# Patient Record
Sex: Female | Born: 1990 | Race: Black or African American | Hispanic: No | Marital: Single | Smoking: Never smoker
Health system: Southern US, Community
[De-identification: ages and names within clinical notes are randomized; demographics above are authoritative.]

## PROBLEM LIST (undated history)

## (undated) DIAGNOSIS — L511 Stevens-Johnson syndrome: Secondary | ICD-10-CM

## (undated) DIAGNOSIS — J45909 Unspecified asthma, uncomplicated: Secondary | ICD-10-CM

## (undated) DIAGNOSIS — G43909 Migraine, unspecified, not intractable, without status migrainosus: Secondary | ICD-10-CM

## (undated) HISTORY — PX: ABDOMINAL HYSTERECTOMY: SHX81

## (undated) HISTORY — PX: TONSILLECTOMY: SUR1361

---

## 2018-04-26 ENCOUNTER — Emergency Department (HOSPITAL_COMMUNITY)
Admission: EM | Admit: 2018-04-26 | Discharge: 2018-04-26 | Disposition: A | Discharge status: Home / Self Care | Payer: No Typology Code available for payment source | Attending: Emergency Medicine | Admitting: Emergency Medicine

## 2018-04-26 ENCOUNTER — Encounter (HOSPITAL_COMMUNITY): Payer: Self-pay | Admitting: Emergency Medicine

## 2018-04-26 ENCOUNTER — Other Ambulatory Visit: Payer: Self-pay

## 2018-04-26 DIAGNOSIS — Y999 Unspecified external cause status: Secondary | ICD-10-CM | POA: Diagnosis not present

## 2018-04-26 DIAGNOSIS — Y92414 Local residential or business street as the place of occurrence of the external cause: Secondary | ICD-10-CM | POA: Diagnosis not present

## 2018-04-26 DIAGNOSIS — M25511 Pain in right shoulder: Secondary | ICD-10-CM | POA: Diagnosis not present

## 2018-04-26 DIAGNOSIS — Y9389 Activity, other specified: Secondary | ICD-10-CM | POA: Insufficient documentation

## 2018-04-26 DIAGNOSIS — M546 Pain in thoracic spine: Secondary | ICD-10-CM | POA: Diagnosis not present

## 2018-04-26 HISTORY — DX: Stevens-Johnson syndrome: L51.1

## 2018-04-26 MED ORDER — IBUPROFEN 800 MG PO TABS: 800.0000 mg | ORAL_TABLET | Freq: Three times a day (TID) | ORAL | 0 refills | Status: DC

## 2018-04-26 MED ORDER — METHOCARBAMOL 500 MG PO TABS: 500.0000 mg | ORAL_TABLET | Freq: Two times a day (BID) | ORAL | 0 refills | Status: DC

## 2018-04-26 MED ORDER — KETOROLAC TROMETHAMINE 60 MG/2ML IM SOLN
60.0000 mg | Freq: Once | INTRAMUSCULAR | Status: AC
  Administered 2018-04-26: 60 mg via INTRAMUSCULAR
  Filled 2018-04-26: qty 2
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
## 2018-04-26 NOTE — Discharge Instructions (Signed)
Take the prescribed medication as directed.  Can use heat therapy on your back. Follow-up with your primary care doctor. Return to the ED for new or worsening symptoms.

## 2020-09-24 ENCOUNTER — Other Ambulatory Visit: Payer: Self-pay

## 2020-09-24 ENCOUNTER — Encounter (HOSPITAL_BASED_OUTPATIENT_CLINIC_OR_DEPARTMENT_OTHER): Payer: Self-pay | Admitting: Emergency Medicine

## 2020-09-24 ENCOUNTER — Emergency Department (HOSPITAL_BASED_OUTPATIENT_CLINIC_OR_DEPARTMENT_OTHER)
Admission: EM | Admit: 2020-09-24 | Discharge: 2020-09-24 | Disposition: A | Discharge status: Home / Self Care | Payer: Medicaid Other | Attending: Emergency Medicine | Admitting: Emergency Medicine

## 2020-09-24 DIAGNOSIS — R1013 Epigastric pain: Secondary | ICD-10-CM | POA: Diagnosis not present

## 2020-09-24 DIAGNOSIS — M79602 Pain in left arm: Secondary | ICD-10-CM | POA: Insufficient documentation

## 2020-09-24 MED ORDER — NAPROXEN 500 MG PO TABS: 500.0000 mg | ORAL_TABLET | Freq: Two times a day (BID) | ORAL | 0 refills | Status: DC

## 2020-09-24 MED ORDER — KETOROLAC TROMETHAMINE 15 MG/ML IJ SOLN
15.0000 mg | Freq: Once | INTRAMUSCULAR | Status: AC
  Administered 2020-09-24: 15 mg via INTRAMUSCULAR
  Filled 2020-09-24: qty 1

## 2020-09-24 MED ORDER — ALUM & MAG HYDROXIDE-SIMETH 200-200-20 MG/5ML PO SUSP
30.0000 mL | Freq: Once | ORAL | Status: AC
  Administered 2020-09-24: 30 mL via ORAL
  Filled 2020-09-24: qty 30

## 2020-09-24 NOTE — ED Provider Notes (Signed)
[facility] EMERGENCY DEPARTMENT Provider Note   CSN: [id number] Arrival date & time: 09/24/20  2154     History Chief Complaint  Patient presents with  . Arm Pain    Beth Allison is a 30 y.o. female presenting for evaluation of left wrist and forearm pain and epigastric pain.  Patient states several hours ago she was folding laundry when she suddenly had pain in her left wrist and left forearm.  She describes it as a tingling feeling as well as pain.  She states her hand feels weak.  It does not extend into her upper arm.  No pain in her shoulder.  She denies fall, trauma, or injury.  She was not leaning on her elbows or her wrist at the time.  She is never had anything similar.  No symptoms on the right side.  She is right-handed.  She denies neck pain.  No erythema or warmth.  No fevers.  She has not taken anything for her symptoms. Additionally, she states around the same time she developed epigastric abdominal pain.  She has a history of ulcers, but states this does not feel similar.  She is on medication for GERD every day, has been taking as prescribed.  When asked if she wants further evaluation about her stomach, she states she is here today for her arm, not her stomach.  HPI     Past Medical History:  Diagnosis Date  . Stevens-Johnson disease (HCC)     There are no problems to display for this patient.   Past Surgical History:  Procedure Laterality Date  . ABDOMINAL HYSTERECTOMY    . CESAREAN SECTION       OB History   No obstetric history on file.     No family history on file.  Social History   Tobacco Use  . Smoking status: Never Smoker  . Smokeless tobacco: Never Used  Substance Use Topics  . Alcohol use: Yes  . Drug use: Never    Home Medications Prior to Admission medications   Medication Sig Start Date End Date Taking? Authorizing Provider  naproxen (NAPROSYN) 500 MG tablet Take 1 tablet (500 mg total) by mouth 2 (two) times daily  with a meal. 09/24/20  Yes Caccavale, Sophia, PA-C  ibuprofen (ADVIL,MOTRIN) 800 MG tablet Take 1 tablet (800 mg total) by mouth 3 (three) times daily. 04/26/18   Garlon Hatchet, PA-C  methocarbamol (ROBAXIN) 500 MG tablet Take 1 tablet (500 mg total) by mouth 2 (two) times daily. 04/26/18   Garlon Hatchet, PA-C    Allergies    Tylenol [acetaminophen]  Review of Systems   Review of Systems  Gastrointestinal: Positive for abdominal pain.  Musculoskeletal: Positive for arthralgias.  All other systems reviewed and are negative.   Physical Exam Updated Vital Signs BP (!) 131/95 (BP Location: Right Arm)   Pulse 79   Temp 99 F (37.2 C) (Oral)   Resp 20   Ht 5' (1.524 m)   Wt 97.5 kg   SpO2 100%   BMI 41.99 kg/m   Physical Exam Vitals and nursing note reviewed.  Constitutional:      General: She is not in acute distress.    Appearance: She is well-developed and well-nourished. She is obese.     Comments: Resting in the bed in NAD  HENT:     Head: Normocephalic and atraumatic.  Eyes:     Extraocular Movements: Extraocular movements intact and EOM normal.  Conjunctiva/sclera: Conjunctivae normal.     Pupils: Pupils are equal, round, and reactive to light.  Cardiovascular:     Rate and Rhythm: Normal rate and regular rhythm.     Pulses: Normal pulses and intact distal pulses.  Pulmonary:     Effort: Pulmonary effort is normal. No respiratory distress.     Breath sounds: Normal breath sounds. No wheezing.  Abdominal:     General: There is no distension.     Palpations: Abdomen is soft. There is no mass.     Tenderness: There is no abdominal tenderness. There is no guarding or rebound.     Comments: No ttp on my evaluation. No rigidity, guarding, distention  Musculoskeletal:     Cervical back: Normal range of motion and neck supple.     Comments: No erythema, warmth, or swelling of any joint noted.  Full active range of motion at the shoulder and elbow without difficulty.   Positive Tinel's on the left side. Grip strength equal bilaterally. Pt reports decreased sensation of L hand.   Skin:    General: Skin is warm and dry.     Capillary Refill: Capillary refill takes less than 2 seconds.  Neurological:     Mental Status: She is alert and oriented to person, place, and time.  Psychiatric:        Mood and Affect: Mood and affect normal.     ED Results / Procedures / Treatments   Labs (all labs ordered are listed, but only abnormal results are displayed) Labs Reviewed - No data to display  EKG None  Radiology No results found.  Procedures Procedures   Medications Ordered in ED Medications  ketorolac (TORADOL) 15 MG/ML injection 15 mg (15 mg Intramuscular Given 09/24/20 2327)  alum & mag hydroxide-simeth (MAALOX/MYLANTA) 200-200-20 MG/5ML suspension 30 mL (30 mLs Oral Given 09/24/20 2327)    ED Course  I have reviewed the triage vital signs and the nursing notes.  Pertinent labs & imaging results that were available during my care of the patient were reviewed by me and considered in my medical decision making (see chart for details).    MDM Rules/Calculators/A&P                          Patient presenting for evaluation of left wrist and arm pain.  On exam, patient appears nontoxic.  No neurologic deficits noted on my exam other than slight decrease sensation of the left hand.  Have her grip strength equal.  Positive Tinel's.  Likely carpal tunnel.  No neck pain or indication of cervical radiculopathy.  No other neurologic deficits, considering patient's age and last lack of risk factors, doubt stroke.  Discussed treatment with NSAIDs, brace, and ice.  Encourage follow-up with PCP/hand as needed. Regarding patient's abdominal pain, patient states she is here today for her arm pain.  She does not want further abdominal work-up.  I encourage symptomatic treatment and follow-up with GI as needed.  At this time, patient appears safe for discharge.  Return  precautions given.  Patient states she understands and agrees to plan.   Final Clinical Impression(s) / ED Diagnoses Final diagnoses:  Left arm pain  Epigastric abdominal pain    Rx / DC Orders ED Discharge Orders         Ordered    naproxen (NAPROSYN) 500 MG tablet  2 times daily with meals        09/24/20 2335  Alveria Apley, PA-C 09/24/20 2347    Paula Libra, MD 09/25/20 [phone]

## 2020-09-24 NOTE — Discharge Instructions (Addendum)
Your symptoms of your left forearm are likely due to nerve irritation/inflammation such as carpal tunnel.  Wear the brace, especially at night to help with positioning. Take naproxen 2 times a day with meals.  Do not take other anti-inflammatories at the same time (Advil, Motrin, ibuprofen, Aleve). You may supplement with Tylenol if you need further pain control. Use ice packs on the inside of your wrist and the outside of your elbow where you have pain for 20 minutes, 3 times a day to help with pain and swelling. Follow-up with your primary care doctor and/or the hand doctor listed below if your symptoms are not improving after a week of this treatment. Return to the emergency room if you develop fevers, severe worsening pain, color change of your hand, or any new, worsening, or concerning symptoms.

## 2020-09-24 NOTE — ED Triage Notes (Signed)
L arm pain and tingling from elbow down x3 hours. Pt has complete ROM. No injury or heavy lifting. Also reports epigastric tenderness x 2-3 hours. Denies other sx.

## 2021-02-04 ENCOUNTER — Encounter (HOSPITAL_BASED_OUTPATIENT_CLINIC_OR_DEPARTMENT_OTHER): Payer: Self-pay | Admitting: *Deleted

## 2021-02-04 ENCOUNTER — Emergency Department (HOSPITAL_BASED_OUTPATIENT_CLINIC_OR_DEPARTMENT_OTHER)
Admission: EM | Admit: 2021-02-04 | Discharge: 2021-02-04 | Disposition: A | Discharge status: Home / Self Care | Payer: Medicaid Other | Attending: Emergency Medicine | Admitting: Emergency Medicine

## 2021-02-04 ENCOUNTER — Other Ambulatory Visit: Payer: Self-pay

## 2021-02-04 DIAGNOSIS — Z79899 Other long term (current) drug therapy: Secondary | ICD-10-CM | POA: Diagnosis not present

## 2021-02-04 DIAGNOSIS — R040 Epistaxis: Secondary | ICD-10-CM | POA: Insufficient documentation

## 2021-02-04 DIAGNOSIS — I10 Essential (primary) hypertension: Secondary | ICD-10-CM | POA: Diagnosis not present

## 2021-02-04 DIAGNOSIS — J45909 Unspecified asthma, uncomplicated: Secondary | ICD-10-CM | POA: Insufficient documentation

## 2021-02-04 HISTORY — DX: Unspecified asthma, uncomplicated: J45.909

## 2021-02-04 MED ORDER — PROCHLORPERAZINE MALEATE 10 MG PO TABS: 10.0000 mg | ORAL_TABLET | Freq: Two times a day (BID) | ORAL | 0 refills | Status: DC | PRN

## 2021-02-04 NOTE — ED Provider Notes (Signed)
[facility] EMERGENCY DEPARTMENT Provider Note   CSN: [id number] Arrival date & time: 02/04/21  1728     History Chief Complaint  Patient presents with   Epistaxis    Beth Allison is a 30 y.o. female.  30 yo F with a chief complaints of epistaxis.  Is been off and on for the past 3 days.  She denies any trauma to the nose.  Seems to come and go at random.  Her mother checked her blood pressure while she was having nosebleed and told her it was significantly elevated and she should come to the ED for evaluation.  Patient has been having migraines off and on for the past 3 days as well.  Tells me she has a known history of migraines.  Denies one-sided numbness weakness difficulty speech swallowing denies trauma denies fever.  Denies sinus congestion.  Denies ear pain.  The history is provided by the patient.  Epistaxis Location:  Unable to specify Severity:  Moderate Duration:  3 days Timing:  Intermittent Progression:  Waxing and waning Chronicity:  New Relieved by:  Nothing Worsened by:  Nothing Ineffective treatments:  None tried Associated symptoms: no congestion, no dizziness, no fever and no headaches       Past Medical History:  Diagnosis Date   Asthma    Stevens-Johnson disease (HCC)     There are no problems to display for this patient.   Past Surgical History:  Procedure Laterality Date   ABDOMINAL HYSTERECTOMY     CESAREAN SECTION       OB History   No obstetric history on file.     No family history on file.  Social History   Tobacco Use   Smoking status: Never   Smokeless tobacco: Never  Vaping Use   Vaping Use: Never used  Substance Use Topics   Alcohol use: Not Currently   Drug use: Never    Home Medications Prior to Admission medications   Medication Sig Start Date End Date Taking? Authorizing Provider  prochlorperazine (COMPAZINE) 10 MG tablet Take 1 tablet (10 mg total) by mouth 2 (two) times daily as needed  (headache). 02/04/21  Yes Melene Plan, DO  ibuprofen (ADVIL,MOTRIN) 800 MG tablet Take 1 tablet (800 mg total) by mouth 3 (three) times daily. 04/26/18   Garlon Hatchet, PA-C  methocarbamol (ROBAXIN) 500 MG tablet Take 1 tablet (500 mg total) by mouth 2 (two) times daily. 04/26/18   Garlon Hatchet, PA-C  naproxen (NAPROSYN) 500 MG tablet Take 1 tablet (500 mg total) by mouth 2 (two) times daily with a meal. 09/24/20   Caccavale, Sophia, PA-C    Allergies    Other and Tylenol [acetaminophen]  Review of Systems   Review of Systems  Constitutional:  Negative for chills and fever.  HENT:  Positive for nosebleeds. Negative for congestion and rhinorrhea.   Eyes:  Negative for redness and visual disturbance.  Respiratory:  Negative for shortness of breath and wheezing.   Cardiovascular:  Negative for chest pain and palpitations.  Gastrointestinal:  Negative for nausea and vomiting.  Genitourinary:  Negative for dysuria and urgency.  Musculoskeletal:  Negative for arthralgias and myalgias.  Skin:  Negative for pallor and wound.  Neurological:  Negative for dizziness and headaches.   Physical Exam Updated Vital Signs BP (!) 137/94 (BP Location: Right Arm)   Pulse 77   Temp 98.5 F (36.9 C) (Oral)   Resp 16   Ht 5' (1.524 m)  Wt 93 kg   SpO2 100%   BMI 40.04 kg/m   Physical Exam Vitals and nursing note reviewed.  Constitutional:      General: She is not in acute distress.    Appearance: She is well-developed. She is not diaphoretic.  HENT:     Head: Normocephalic and atraumatic.     Nose:     Comments: Possible nasal polyp, otherwise no abnormality, no signs of bleeding no increased vascularity at Kiesselbach's plexus.  No significant sinus tenderness to percussion.  Mild posterior nasal drip.  TMs without effusion or bulging. Eyes:     Pupils: Pupils are equal, round, and reactive to light.  Cardiovascular:     Rate and Rhythm: Normal rate and regular rhythm.     Heart sounds: No  murmur heard.   No friction rub. No gallop.  Pulmonary:     Effort: Pulmonary effort is normal.     Breath sounds: No wheezing or rales.  Abdominal:     General: There is no distension.     Palpations: Abdomen is soft.     Tenderness: There is no abdominal tenderness.  Musculoskeletal:        General: No tenderness.     Cervical back: Normal range of motion and neck supple.  Skin:    General: Skin is warm and dry.  Neurological:     Mental Status: She is alert and oriented to person, place, and time.  Psychiatric:        Behavior: Behavior normal.    ED Results / Procedures / Treatments   Labs (all labs ordered are listed, but only abnormal results are displayed) Labs Reviewed - No data to display  EKG None  Radiology No results found.  Procedures Procedures   Medications Ordered in ED Medications - No data to display  ED Course  I have reviewed the triage vital signs and the nursing notes.  Pertinent labs & imaging results that were available during my care of the patient were reviewed by me and considered in my medical decision making (see chart for details).    MDM Rules/Calculators/A&P                          30 yo F with a chief complaints of epistaxis.  Off and on for 3 days.  Her mother checked her blood pressure while she was having a nosebleed and noted it was elevated.  She is here to be evaluated.  Has had headaches off and on for 3 days as well.  Well-appearing and nontoxic no gross neurologic deficit.  No ongoing bleeding.  Blood pressure is very mildly elevated here.  We will have her follow-up with her family doctor for possible blood pressure management.  Unfortunately is driving home and is unable to get her headache cocktail here.  Has a Tylenol allergy and so unable to give her Fioricet.  We will do a trial of oral Compazine at home.  6:32 PM:  I have discussed the diagnosis/risks/treatment options with the patient and believe the pt to be eligible  for discharge home to follow-up with PCP. We also discussed returning to the ED immediately if new or worsening sx occur. We discussed the sx which are most concerning (e.g., sudden worsening pain, fever, inability to tolerate by mouth, epistaxis that does not improve with pressure) that necessitate immediate return. Medications administered to the patient during their visit and any new prescriptions provided to the  patient are listed below.  Medications given during this visit Medications - No data to display   The patient appears reasonably screen and/or stabilized for discharge and I doubt any other medical condition or other [facility] requiring further screening, evaluation, or treatment in the ED at this time prior to discharge.   Final Clinical Impression(s) / ED Diagnoses Final diagnoses:  Epistaxis  Primary hypertension    Rx / DC Orders ED Discharge Orders          Ordered    prochlorperazine (COMPAZINE) 10 MG tablet  2 times daily PRN        02/04/21 1828             Melene Plan, DO 02/04/21 1832

## 2021-02-04 NOTE — Discharge Instructions (Addendum)
If you have another nosebleed please hold pressure for 15 minutes without peaking.  If this does not resolve the bleeding try again and you can apply ice to the bridge of your nose as well.  If this does not stop the bleeding then return to the emergency department for evaluation.  Discussed with your family doctor tomorrow that your blood pressure was elevated.  They may want to check it in the office when you are not having a headache or nosebleeds.  Tried the medicine I prescribed to see if it helps your headache.  Sometimes it helps to take it with Benadryl.  He can also take ibuprofen or naproxen.

## 2021-02-04 NOTE — ED Triage Notes (Signed)
Pt reports headaches ("migraines"), with elevated BP (164/94) and nosebleeds x 3 days. Reports last nosebleed around noon. States she has headache now and took ibuprofen around 2 pm. No bleeding currently

## 2021-02-11 ENCOUNTER — Other Ambulatory Visit: Payer: Self-pay

## 2021-02-11 DIAGNOSIS — R040 Epistaxis: Secondary | ICD-10-CM | POA: Diagnosis not present

## 2021-02-11 DIAGNOSIS — R519 Headache, unspecified: Secondary | ICD-10-CM | POA: Insufficient documentation

## 2021-02-11 DIAGNOSIS — J45909 Unspecified asthma, uncomplicated: Secondary | ICD-10-CM | POA: Diagnosis not present

## 2021-02-11 NOTE — ED Triage Notes (Signed)
Pt c/o headaches, nose bleeds and neck pain for several days. Been seen multiple ERs for same. No bleeding at this time

## 2021-02-12 ENCOUNTER — Encounter (HOSPITAL_BASED_OUTPATIENT_CLINIC_OR_DEPARTMENT_OTHER): Payer: Self-pay | Admitting: Emergency Medicine

## 2021-02-12 ENCOUNTER — Emergency Department (HOSPITAL_BASED_OUTPATIENT_CLINIC_OR_DEPARTMENT_OTHER): Payer: Medicaid Other

## 2021-02-12 ENCOUNTER — Emergency Department (HOSPITAL_BASED_OUTPATIENT_CLINIC_OR_DEPARTMENT_OTHER)
Admission: EM | Admit: 2021-02-12 | Discharge: 2021-02-12 | Disposition: A | Discharge status: Home / Self Care | Payer: Medicaid Other | Attending: Emergency Medicine | Admitting: Emergency Medicine

## 2021-02-12 DIAGNOSIS — R519 Headache, unspecified: Secondary | ICD-10-CM

## 2021-02-12 LAB — PREGNANCY, URINE: Preg Test, Ur: NEGATIVE

## 2021-02-12 MED ORDER — NAPROXEN 250 MG PO TABS
500.0000 mg | ORAL_TABLET | ORAL | Status: AC
  Administered 2021-02-12: 500 mg via ORAL
  Filled 2021-02-12: qty 2

## 2021-02-12 NOTE — ED Provider Notes (Signed)
[facility] EMERGENCY DEPARTMENT Provider Note   CSN: [id number] Arrival date & time: 02/11/21  2042     History Chief Complaint  Patient presents with   Epistaxis   Headache    Beth Allison is a 30 y.o. female.  The history is provided by the patient.  Headache Pain location:  Generalized Quality:  Dull Radiates to:  Does not radiate Severity currently:  8/10 Severity at highest:  9/10 Onset quality:  Gradual Duration:  1 week Timing:  Constant Progression:  Waxing and waning Chronicity:  Recurrent Similar to prior headaches: yes   Context: not exposure to bright light, not caffeine, not coughing and not intercourse   Relieved by:  Nothing Worsened by:  Nothing Ineffective treatments:  None tried Associated symptoms: no abdominal pain, no back pain, no blurred vision, no congestion, no cough, no diarrhea, no dizziness, no drainage, no ear pain, no eye pain, no facial pain, no fatigue, no fever, no focal weakness, no hearing loss, no loss of balance, no nausea, no near-syncope, no neck stiffness, no numbness, no paresthesias, no photophobia, no seizures, no sinus pressure, no sore throat, no swollen glands, no syncope, no tingling, no URI, no visual change, no vomiting and no weakness   Risk factors: no anger   Patient reports a h/o migraines and states this headache is similar for one week but hasn't taken anything secondary to [employer] from antibiotics.  No f/c/r.  Has intermittently had a nose bleed.  Has not tried any of the therapies suggested on previous ER visits.  No Confusion, no proptosis.      Past Medical History:  Diagnosis Date   Asthma    Stevens-Johnson disease (HCC)     There are no problems to display for this patient.   Past Surgical History:  Procedure Laterality Date   ABDOMINAL HYSTERECTOMY     CESAREAN SECTION       OB History   No obstetric history on file.     History reviewed. No pertinent family  history.  Social History   Tobacco Use   Smoking status: Never   Smokeless tobacco: Never  Vaping Use   Vaping Use: Never used  Substance Use Topics   Alcohol use: Not Currently   Drug use: Never    Home Medications Prior to Admission medications   Medication Sig Start Date End Date Taking? Authorizing Provider  ibuprofen (ADVIL,MOTRIN) 800 MG tablet Take 1 tablet (800 mg total) by mouth 3 (three) times daily. 04/26/18   Garlon Hatchet, PA-C  methocarbamol (ROBAXIN) 500 MG tablet Take 1 tablet (500 mg total) by mouth 2 (two) times daily. 04/26/18   Garlon Hatchet, PA-C  naproxen (NAPROSYN) 500 MG tablet Take 1 tablet (500 mg total) by mouth 2 (two) times daily with a meal. 09/24/20   Caccavale, Sophia, PA-C  prochlorperazine (COMPAZINE) 10 MG tablet Take 1 tablet (10 mg total) by mouth 2 (two) times daily as needed (headache). 02/04/21   Melene Plan, DO    Allergies    Other and Tylenol [acetaminophen]  Review of Systems   Review of Systems  Constitutional:  Negative for fatigue and fever.  HENT:  Negative for congestion, ear pain, hearing loss, postnasal drip, sinus pressure and sore throat.   Eyes:  Negative for blurred vision, photophobia and pain.  Respiratory:  Negative for cough.   Cardiovascular:  Negative for leg swelling, syncope and near-syncope.  Gastrointestinal:  Negative for abdominal pain, diarrhea, nausea and vomiting.  Genitourinary:  Negative for difficulty urinating.  Musculoskeletal:  Negative for back pain and neck stiffness.  Skin:  Negative for rash.  Neurological:  Negative for dizziness, focal weakness, seizures, facial asymmetry, speech difficulty, weakness, numbness, paresthesias and loss of balance.  Psychiatric/Behavioral:  Negative for agitation and confusion.   All other systems reviewed and are negative.  Physical Exam Updated Vital Signs BP 110/89   Pulse 68   Temp 98.3 F (36.8 C) (Oral)   Resp 18   Ht 5' (1.524 m)   Wt 97.5 kg   SpO2  98%   BMI 41.99 kg/m   Physical Exam Vitals and nursing note reviewed.  Constitutional:      General: She is not in acute distress.    Appearance: Normal appearance.  HENT:     Head: Normocephalic.     Comments:  Proptosis, disks are sharp, intact cognition     Nose: Nose normal.     Mouth/Throat:     Mouth: Mucous membranes are moist.  Eyes:     Extraocular Movements: Extraocular movements intact.     Conjunctiva/sclera: Conjunctivae normal.     Pupils: Pupils are equal, round, and reactive to light.  Cardiovascular:     Rate and Rhythm: Normal rate and regular rhythm.     Pulses: Normal pulses.     Heart sounds: Normal heart sounds.  Pulmonary:     Effort: Pulmonary effort is normal.     Breath sounds: Normal breath sounds.  Abdominal:     General: Abdomen is flat. Bowel sounds are normal.     Palpations: Abdomen is soft.     Tenderness: There is no abdominal tenderness. There is no guarding.  Musculoskeletal:        General: Normal range of motion.     Cervical back: Normal range of motion and neck supple.  Skin:    General: Skin is warm and dry.     Capillary Refill: Capillary refill takes less than 2 seconds.  Neurological:     General: No focal deficit present.     Mental Status: She is alert and oriented to person, place, and time.     Cranial Nerves: No cranial nerve deficit.     Deep Tendon Reflexes: Reflexes normal.  Psychiatric:        Mood and Affect: Mood normal.        Behavior: Behavior normal.    ED Results / Procedures / Treatments   Labs (all labs ordered are listed, but only abnormal results are displayed) Labs Reviewed  PREGNANCY, URINE    EKG None  Radiology CT Head Wo Contrast  Result Date: 02/12/2021 CLINICAL DATA:  Headaches, epistaxis and neck pain for several evaluated of multiple emergency departments EXAM: CT HEAD WITHOUT CONTRAST TECHNIQUE: Contiguous axial images were obtained from the base of the skull through the vertex  without intravenous contrast. COMPARISON:  Maxillofacial CT 09/08/2016, CT head 07/23/2016 FINDINGS: Brain: No evidence of acute infarction, hemorrhage, hydrocephalus, extra-axial collection, visible mass lesion or mass effect. Vascular: No hyperdense vessel or unexpected calcification. Skull: No calvarial fracture or suspicious osseous lesion. No scalp swelling or hematoma. Sinuses/Orbits: Minimal mural thickening in the paranasal sinuses. No layering air-fluid levels or pneumatized secretions. Mastoid air cells are clear. Middle ear cavities are clear. Included orbital structures are unremarkable. Other: None IMPRESSION: No acute intracranial abnormality. Electronically Signed   By: Kreg Shropshire M.D.   On: 02/12/2021 00:38    Procedures Procedures   Medications Ordered in  ED Medications  naproxen (NAPROSYN) tablet 500 mg (has no administration in time range)    ED Course  I have reviewed the triage vital signs and the nursing notes.  Pertinent labs & imaging results that were available during my care of the patient were reviewed by me and considered in my medical decision making (see chart for details).    MNo proptosis, no changes in cognition, disks are sharp.  I do not believe this is a dural sinus thrombosis.  No signs of meningitis.  No SAH.  Well appearing.  Take ibuprofen at home for pain and use a vaporizer to prevent nose bleeds.  Take to your family doctor.    Stable for discharge with close follow up.    Beth Allison was evaluated in Emergency Department on 02/12/2021 for the symptoms described in the history of present illness. She was evaluated in the context of the global COVID-19 pandemic, which necessitated consideration that the patient might be at risk for infection with the SARS-CoV-2 virus that causes COVID-19. Institutional protocols and algorithms that pertain to the evaluation of patients at risk for COVID-19 are in a state of rapid change based on information released  by regulatory bodies including the CDC and federal and state organizations. These policies and algorithms were followed during the patient's care in the ED.  Final Clinical Impression(s) / ED Diagnoses Final diagnoses:  None  Return for intractable cough, coughing up blood, fevers > 100.4 unrelieved by medication, shortness of breath, intractable vomiting, chest pain, shortness of breath, weakness, numbness, changes in speech, facial asymmetry, abdominal pain, passing out, Inability to tolerate liquids or food, cough, altered mental status or any concerns. No signs of systemic illness or infection. The patient is nontoxic-appearing on exam and vital signs are within normal limits. I have reviewed the triage vital signs and the nursing notes. Pertinent labs & imaging results that were available during my care of the patient were reviewed by me and considered in my medical decision making (see chart for details). After history, exam, and medical workup I feel the patient has been appropriately medically screened and is safe for discharge home. Pertinent diagnoses were discussed with the patient. Patient was given return precautions.  Rx / DC Orders ED Discharge Orders     None        Demaris Leavell, MD 02/12/21 [phone]

## 2021-06-10 NOTE — Progress Notes (Deleted)
NEW PATIENT Date of Service/Encounter:  06/10/21 Referring provider: Alm Bustard, MD Primary care provider: [facility]  Subjective:  Beth Allison is a 30 y.o. female with a PMHx of migraine and depression presenting today for evaluation of *** History obtained from: chart review and {Persons; PED relatives w/patient:19415::"patient"}.   + trichomonas: Flagyl desensitization  Other allergy screening: Asthma: {Blank single:19197::"yes","no"} Rhino conjunctivitis: {Blank single:19197::"yes","no"} Food allergy: {Blank single:19197::"yes","no"} Medication allergy: {Blank single:19197::"yes","no"} Hymenoptera allergy: {Blank single:19197::"yes","no"} Urticaria: {Blank single:19197::"yes","no"} Eczema:{Blank single:19197::"yes","no"} History of recurrent infections suggestive of immunodeficency: {Blank single:19197::"yes","no"} ***Vaccinations are up to date.   Past Medical History: Past Medical History:  Diagnosis Date   Asthma    Stevens-Johnson disease (HCC)    Medication List:  Current Outpatient Medications  Medication Sig Dispense Refill   ibuprofen (ADVIL,MOTRIN) 800 MG tablet Take 1 tablet (800 mg total) by mouth 3 (three) times daily. 21 tablet 0   methocarbamol (ROBAXIN) 500 MG tablet Take 1 tablet (500 mg total) by mouth 2 (two) times daily. 20 tablet 0   naproxen (NAPROSYN) 500 MG tablet Take 1 tablet (500 mg total) by mouth 2 (two) times daily with a meal. 20 tablet 0   prochlorperazine (COMPAZINE) 10 MG tablet Take 1 tablet (10 mg total) by mouth 2 (two) times daily as needed (headache). 10 tablet 0   No current facility-administered medications for this visit.   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 Social History: Beth Allison lives ***.   ROS:  All other systems negative except as noted per HPI.  Objective:  There were no vitals taken for this visit. There is no height or weight on file to calculate BMI. Physical Exam:  General Appearance:  Alert, cooperative, no distress, appears stated age  Head:  Normocephalic, without obvious abnormality, atraumatic  Eyes:  Conjunctiva clear, EOM's intact  Nose: Nares normal  Throat: Lips, tongue normal; teeth and gums normal  Neck: Supple, symmetrical  Lungs:   Respirations unlabored, no coughing  Heart:  Appears well perfused  Extremities: No edema  Skin: Skin color, texture, turgor normal, no rashes or lesions on visualized portions of skin  Neurologic: No gross deficits     Diagnostics: Spirometry:  Tracings reviewed. Her effort: {Blank single:19197::"Good reproducible efforts.","It was hard to get consistent efforts and there is a question as to whether this reflects a maximal maneuver.","Poor effort, data can not be interpreted."} FVC: ***L FEV1: ***L, ***% predicted FEV1/FVC ratio: ***% Interpretation: {Blank single:19197::"Spirometry consistent with mild obstructive disease","Spirometry consistent with moderate obstructive disease","Spirometry consistent with severe obstructive disease","Spirometry consistent with possible restrictive disease","Spirometry consistent with mixed obstructive and restrictive disease","Spirometry uninterpretable due to technique","Spirometry consistent with normal pattern","No overt abnormalities noted given today's efforts"}.  Please see scanned spirometry results for details.  Skin Testing: {Blank single:19197::"Select foods","Environmental allergy panel","Environmental allergy panel and select foods","Food allergy panel","None","Deferred due to recent antihistamines use"}. Positive test  to: ***. Negative test to: ***.  Results discussed with patient/family.   {Blank single:19197::"Allergy testing results were read and interpreted by myself, documented by clinical staff."," "}  Assessment and Plan  There are no Patient  Instructions on file for this visit.  No follow-ups on file.  {Blank single:19197::"This note in its entirety was forwarded to the Provider who requested this consultation."}  Thank you for your kind referral. I appreciate the opportunity to take part in Beth Allison's care. Please do not hesitate to contact me with questions.***  Sincerely,  Tonny Bollman, MD [facility]

## 2021-06-11 ENCOUNTER — Ambulatory Visit: Payer: Medicaid Other | Admitting: Internal Medicine

## 2021-06-19 ENCOUNTER — Ambulatory Visit (INDEPENDENT_AMBULATORY_CARE_PROVIDER_SITE_OTHER): Payer: Medicaid Other | Admitting: Internal Medicine

## 2021-06-19 ENCOUNTER — Other Ambulatory Visit: Payer: Self-pay

## 2021-06-19 ENCOUNTER — Encounter: Payer: Self-pay | Admitting: Internal Medicine

## 2021-06-19 VITALS — BP 120/70 | HR 76 | Temp 98.4°F | Resp 20 | Ht 61.0 in | Wt 225.8 lb

## 2021-06-19 DIAGNOSIS — L511 Stevens-Johnson syndrome: Secondary | ICD-10-CM | POA: Diagnosis not present

## 2021-06-19 DIAGNOSIS — J3089 Other allergic rhinitis: Secondary | ICD-10-CM | POA: Diagnosis not present

## 2021-06-19 DIAGNOSIS — J4530 Mild persistent asthma, uncomplicated: Secondary | ICD-10-CM | POA: Diagnosis not present

## 2021-06-19 NOTE — Progress Notes (Signed)
NEW PATIENT Date of Service/Encounter:  06/19/21 Referring provider: [facility] Primary care provider: [facility]  Subjective:  Beth Allison is a 30 y.o. female with a PMHx of obesity, persistent asthma, multiple drug reactions, OSA  presenting today for evaluation of flagyl allergy in the setting  of trichomonas infection  History obtained from: chart review and patient.   1) Drug Reactions: Reaction occurred on 12/12/2016 -  presenting as mucositis, hepatitis with oral, occular, and vaginal involvement.  External skin involvement totaled 35% and she was treated in the Burn Unit for care.  At that time ciprofloxacin was the potential culprit drug per [facility] discharge note.  However records show exposure to amoxillicin, ceftriaxone, ciprofloxacin and flagyl x1 in the ED 24 hours prior to onset of symptoms.  She has avoided all of these medications since.  She was treated with IV steroids and conservative measures in the BICU.    2) Her allergy and rhinitis are managed by [facility] (unknown provider and records are unavailable).  She reports recent allergy testing to food and environmental with plan to start AIT.  They prescribe her albuterol, symbicort as well.  Denies any hospitalizations, ED visits, OCS or antibiotics in past few months. No lifetime hospitalizations or intubations for asthma.  She is not on any medical therapy for rhinitis at this time.   Other allergy screening: Asthma: yes Rhino conjunctivitis: yes Food allergy: no Medication allergy: yes : multiple drug allergies including SJS like reaction including: all beta lactams, cipro, flagyl , bactrim, avoids carbapenams due to cross reactivity concerns  Hymenoptera allergy: no Urticaria: no Eczema:no History of recurrent infections suggestive of immunodeficency: no Age appropriate Vaccinations are up to date.   Past Medical History: Past Medical History:  Diagnosis Date   Asthma     Stevens-Johnson disease (HCC)    Medication List:  Current Outpatient Medications  Medication Sig Dispense Refill   ibuprofen (ADVIL,MOTRIN) 800 MG tablet Take 1 tablet (800 mg total) by mouth 3 (three) times daily. 21 tablet 0   methocarbamol (ROBAXIN) 500 MG tablet Take 1 tablet (500 mg total) by mouth 2 (two) times daily. 20 tablet 0   naproxen (NAPROSYN) 500 MG tablet Take 1 tablet (500 mg total) by mouth 2 (two) times daily with a meal. 20 tablet 0   prochlorperazine (COMPAZINE) 10 MG tablet Take 1 tablet (10 mg total) by mouth 2 (two) times daily as needed (headache). 10 tablet 0   No current facility-administered medications for this visit.   Known Allergies:  Allergies  Allergen Reactions   Other Other (See Comments)    Pt states she is unable to take multiple antibiotics due to SJS   Tylenol [Acetaminophen] Other (See Comments)    Beth Allison   Past Surgical History: Past Surgical History:  Procedure Laterality Date   ABDOMINAL HYSTERECTOMY     CESAREAN SECTION     Family History: No family history on file. Social History: Beth Allison lives single-family home is 30 years old.  She has carpets throughout.  She has gas heat and central cooling.  She does have a dog inside her home.  No smoke exposure inside or outside of her home.  She is not currently working..   ROS:  All other systems negative except as noted per HPI.  Objective:  Blood pressure 120/70, pulse 76, temperature 98.4 F (36.9 C), temperature source Temporal, resp. rate 20, height 5\' 1"  (1.549 m), weight 225 lb 12.8 oz (102.4  kg), SpO2 98 %. Body mass index is 42.66 kg/m. Physical Exam:  General Appearance:  Alert, cooperative, no distress, appears stated age  Head:  Normocephalic, without obvious abnormality, atraumatic  HEENT  Conjunctiva clear, EOM's intact, TM- intact bilaterally, nasal mucosa erythematous with enlarged IT, no rhinnorhea,    Nasal polyposis not noted on limited external  exam   Throat: Lips, tongue normal; teeth and gums normal, oral mucosa normal without exudates, posterior pharyngeal cobblestoning not noted  Neck: Supple, symmetrical  Lungs:   Respirations unlabored, no coughing, Breath Sounds bilaterally, no wheeze, crackles or rales  Heart:  Appears well perfused, S1 S2 normal, no murmurs, rubs or gallops, regular rate or rhythm  Extremities: No edema  Skin: Skin color, texture, turgor normal, no rashes or lesions on visualized portions of skin  Neurologic: No gross deficits   Diagnostics: None performed  Assessment:  Stevens-Johnson Allison (HCC)  Mild persistent asthma without complication  Other allergic rhinitis Plan/Recommendations:   Patient Instructions  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  This is considered one of the Severe Cutaneous Adverse Reactions and it is contraindicated to rechallenge or desensitize to these type of reactions.  I recommend continued strict avoidance of these drugs: penicillins, cefriaxone, ciprofloxacin, flagyl.  Any further work up should be at an academic center.    Mild persistent asthma without complication - Well controlled. Continue symbicort 2 puffs BID and albuterol per asthma action plan .  - Continue to follow up with [facility] which is the primary manager of asthma and allergies   Other allergic rhinitis - Allergy shots will likely help you chronic nasal symptoms.  Agree with starting them.  Will defer to [facility] who is the primary manager of your asthma and allergies   Follow up as needed   Thank you so much for letter me partake in your care today.  Don't hesitate to reach out if you have any additional concerns!  Ferol Luz, MD  [facility] [place] [place]   This note in its entirety was forwarded to the Provider who requested this consultation.

## 2021-06-19 NOTE — Patient Instructions (Addendum)
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  This is considered one of the Severe Cutaneous Adverse Reactions and it is contraindicated to rechallenge or desensitize to these type of reactions.  I recommend continued strict avoidance of these drugs: penicillins, cefriaxone, ciprofloxacin, flagyl.  Any further work up should be at an academic center.    Mild persistent asthma without complication - Well controlled. Continue symbicort 2 puffs BID and albuterol per asthma action plan .  - Continue to follow up with [facility] which is the primary manager of asthma and allergies   Other allergic rhinitis - Allergy shots will likely help you chronic nasal symptoms.  Agree with starting them.  Will defer to [facility] who is the primary manager of your asthma and allergies   Follow up as needed   Thank you so much for letter me partake in your care today.  Don't hesitate to reach out if you have any additional concerns!  Ferol Luz, MD  [facility] [place] [place]

## 2021-07-21 ENCOUNTER — Other Ambulatory Visit: Payer: Self-pay

## 2021-07-21 ENCOUNTER — Encounter (HOSPITAL_BASED_OUTPATIENT_CLINIC_OR_DEPARTMENT_OTHER): Payer: Self-pay | Admitting: Emergency Medicine

## 2021-07-21 ENCOUNTER — Emergency Department (HOSPITAL_BASED_OUTPATIENT_CLINIC_OR_DEPARTMENT_OTHER)
Admission: EM | Admit: 2021-07-21 | Discharge: 2021-07-21 | Disposition: A | Discharge status: Home / Self Care | Payer: Medicaid Other | Attending: Emergency Medicine | Admitting: Emergency Medicine

## 2021-07-21 DIAGNOSIS — S0990XA Unspecified injury of head, initial encounter: Secondary | ICD-10-CM | POA: Diagnosis not present

## 2021-07-21 DIAGNOSIS — Z5321 Procedure and treatment not carried out due to patient leaving prior to being seen by health care provider: Secondary | ICD-10-CM | POA: Insufficient documentation

## 2021-07-21 DIAGNOSIS — W19XXXA Unspecified fall, initial encounter: Secondary | ICD-10-CM | POA: Insufficient documentation

## 2021-07-21 DIAGNOSIS — R42 Dizziness and giddiness: Secondary | ICD-10-CM | POA: Diagnosis not present

## 2021-07-21 NOTE — ED Triage Notes (Signed)
Pt c/o fall last night, due to dizziness, causing head injury. Pt denies + LOC.

## 2021-12-06 ENCOUNTER — Emergency Department (HOSPITAL_BASED_OUTPATIENT_CLINIC_OR_DEPARTMENT_OTHER)
Admission: EM | Admit: 2021-12-06 | Discharge: 2021-12-06 | Disposition: A | Discharge status: Home / Self Care | Payer: Medicaid Other | Attending: Emergency Medicine | Admitting: Emergency Medicine

## 2021-12-06 ENCOUNTER — Other Ambulatory Visit: Payer: Self-pay

## 2021-12-06 DIAGNOSIS — A549 Gonococcal infection, unspecified: Secondary | ICD-10-CM | POA: Insufficient documentation

## 2021-12-06 MED ORDER — GENTAMICIN SULFATE 40 MG/ML IJ SOLN
240.0000 mg | Freq: Once | INTRAMUSCULAR | Status: AC
  Administered 2021-12-06: 240 mg via INTRAMUSCULAR
  Filled 2021-12-06: qty 6

## 2021-12-06 MED ORDER — AZITHROMYCIN 250 MG PO TABS
2000.0000 mg | ORAL_TABLET | Freq: Once | ORAL | Status: AC
  Administered 2021-12-06: 2000 mg via ORAL
  Filled 2021-12-06: qty 8

## 2021-12-06 NOTE — Discharge Instructions (Addendum)
You were seen in the emergency department for gonorrhea treatment.  We have given you the 2 antibiotics.  Continue to monitor how you're doing and return to the ER for new or worsening symptoms.

## 2021-12-06 NOTE — ED Triage Notes (Signed)
Pt here for antibiotic, states she was tested for Gonorrhea and came back positive. States she came from her doctor's office and was sent over here to get "a shot and a pill" because her doctor's office only has Rocephin and she cannot take it.

## 2021-12-07 NOTE — ED Provider Notes (Signed)
[facility] EMERGENCY DEPARTMENT Provider Note   CSN: [id number] Arrival date & time: 12/06/21  1745     History  Chief Complaint  Patient presents with   SEXUALLY TRANSMITTED DISEASE    Beth Allison is a 31 y.o. female who presents emergency department requesting antibiotic treatment for gonorrhea.  Patient states that she tested positive for gonorrhea, and went to her doctor's office but was sent here for "a shot and a pill" because her doctor's office only had Rocephin, and patient cannot take this.  States that she has a history of Stevens-Johnson syndrome with previous use of Rocephin.  HPI     Home Medications Prior to Admission medications   Medication Sig Start Date End Date Taking? Authorizing Provider  albuterol (VENTOLIN HFA) 108 (90 Base) MCG/ACT inhaler ProAir HFA 90 mcg/actuation aerosol inhaler  INL 2 PFS PO Q 6 H PRF WHZ    [provider]  budesonide-formoterol (SYMBICORT) 80-4.5 MCG/ACT inhaler 2 puffs at bedtime. 05/17/21   [provider]  cetirizine (ZYRTEC) 10 MG tablet Take by mouth. 12/06/09   [provider]  dexlansoprazole (DEXILANT) 60 MG capsule Take by mouth. 08/18/18   [provider]  diphenhydrAMINE (BENADRYL) 25 mg capsule Inject into the vein. 12/11/16   [provider]  erythromycin ophthalmic ointment Place into the left eye 2 times daily. 04/22/19   [provider]  FLUoxetine (PROZAC) 40 MG capsule fluoxetine 40 mg capsule  TAKE 1 CAPSULE BY MOUTH EVERY DAY    [provider]  ibuprofen (ADVIL,MOTRIN) 800 MG tablet Take 1 tablet (800 mg total) by mouth 3 (three) times daily. 04/26/18   Garlon Hatchet, PA-C  LORazepam (ATIVAN) 0.5 MG tablet lorazepam 0.5 mg tablet    [provider]  montelukast (SINGULAIR) 10 MG tablet montelukast 10 mg tablet 11/03/19   [provider]  polyvinyl alcohol (LIQUIFILM TEARS) 1.4 % ophthalmic solution Place 2 drops into both  eyes as needed.    [provider]  Prenat w/o A-FE-Methfol-FA-DHA (WESCAP-PN DHA) 27-0.6-0.4-300 MG CAPS Take 1 capsule by mouth daily. 04/22/21   [provider]  zolpidem (AMBIEN) 5 MG tablet zolpidem 5 mg tablet  TAKE 1 TABLET BY MOUTH EVERY DAY AT BEDTIME    [provider]      Allergies    Ceftriaxone, Ciprofloxacin, Flagyl [metronidazole], Tylenol [acetaminophen], and Other    Review of Systems   Review of Systems  Constitutional:  Negative for fever.  Gastrointestinal:  Negative for abdominal pain.  Genitourinary:  Positive for vaginal discharge.  All other systems reviewed and are negative.  Physical Exam Updated Vital Signs BP (!) 139/95 (BP Location: Right Arm)   Pulse 75   Temp 98 F (36.7 C) (Oral)   Resp 18   Ht 5' (1.524 m)   Wt 90.7 kg   SpO2 100%   BMI 39.06 kg/m  Physical Exam Vitals and nursing note reviewed.  Constitutional:      Appearance: Normal appearance.  HENT:     Head: Normocephalic and atraumatic.  Eyes:     Conjunctiva/sclera: Conjunctivae normal.  Pulmonary:     Effort: Pulmonary effort is normal. No respiratory distress.  Skin:    General: Skin is warm and dry.  Neurological:     Mental Status: She is alert.  Psychiatric:        Mood and Affect: Mood normal.        Behavior: Behavior normal.    ED Results /  Procedures / Treatments   Labs (all labs ordered are listed, but only abnormal results are displayed) Labs Reviewed - No data to display  EKG None  Radiology No results found.  Procedures Procedures    Medications Ordered in ED Medications  gentamicin (GARAMYCIN) injection 240 mg (240 mg Intramuscular Given 12/06/21 2046)  azithromycin (ZITHROMAX) tablet 2,000 mg (2,000 mg Oral Given 12/06/21 2046)    ED Course/ Medical Decision Making/ A&P                           Medical Decision Making Risk Prescription drug management.   This patient is a 31 y.o. female who presents to the ED  for concern of requesting antibiotic treatment for gonorrhea.   Past Medical History / Co-morbidities / Social History: Past Medical History:  Diagnosis Date   Asthma    Stevens-Johnson disease (HCC)    Additional history: Chart reviewed. Pertinent results include: Patient had Stevens-Johnson reaction on prior antibiotics.  They were not able to determine which of the several antibiotics she was on at the time caused it, so they advised her to avoid Rocephin, Flagyl, and ciprofloxacin.  Physical Exam: Physical exam performed. The pertinent findings include: Normal vital signs, patient no acute distress.  Does not appear clinically toxic.  Medications: Due to Rocephin allergy, patient received IM gentamicin and p.o. azithromycin for gonorrhea and possible concomitant chlamydia treatment.   Disposition: After consideration of the diagnostic results and the patients response to treatment, I feel that patient's not requiring admission or inpatient treatment for symptoms.  Patient given antibiotics. Discussed reasons to return to the emergency department, and the patient is agreeable to the plan.         Final Clinical Impression(s) / ED Diagnoses Final diagnoses:  Gonorrhea    Rx / DC Orders ED Discharge Orders     None      Portions of this report may have been transcribed using voice recognition software. Every effort was made to ensure accuracy; however, inadvertent computerized transcription errors may be present.    Su Monks, PA-C 12/07/21 0031    Alvira Monday, MD 12/07/21 1205

## 2021-12-20 ENCOUNTER — Other Ambulatory Visit: Payer: Self-pay

## 2021-12-20 ENCOUNTER — Emergency Department (HOSPITAL_BASED_OUTPATIENT_CLINIC_OR_DEPARTMENT_OTHER)
Admission: EM | Admit: 2021-12-20 | Discharge: 2021-12-21 | Disposition: A | Discharge status: Home / Self Care | Payer: Medicaid Other | Attending: Emergency Medicine | Admitting: Emergency Medicine

## 2021-12-20 ENCOUNTER — Encounter (HOSPITAL_BASED_OUTPATIENT_CLINIC_OR_DEPARTMENT_OTHER): Payer: Self-pay | Admitting: Emergency Medicine

## 2021-12-20 DIAGNOSIS — G43809 Other migraine, not intractable, without status migrainosus: Secondary | ICD-10-CM | POA: Insufficient documentation

## 2021-12-20 DIAGNOSIS — R519 Headache, unspecified: Secondary | ICD-10-CM | POA: Diagnosis present

## 2021-12-20 MED ORDER — KETOROLAC TROMETHAMINE 30 MG/ML IJ SOLN
30.0000 mg | Freq: Once | INTRAMUSCULAR | Status: AC
  Administered 2021-12-21: 30 mg via INTRAVENOUS
  Filled 2021-12-20: qty 1

## 2021-12-20 MED ORDER — PROCHLORPERAZINE EDISYLATE 10 MG/2ML IJ SOLN
10.0000 mg | Freq: Once | INTRAMUSCULAR | Status: AC
  Administered 2021-12-21: 10 mg via INTRAVENOUS
  Filled 2021-12-20: qty 2

## 2021-12-20 NOTE — ED Provider Notes (Signed)
[facility] EMERGENCY DEPARTMENT  Provider Note  CSN: [id number] Arrival date & time: 12/20/21 2241  History Chief Complaint  Patient presents with   Migraine    Beth Allison is a 31 y.o. female reports three days of initially L frontal headache, now diffuse with photophobia and nausea. Similar to previous headaches but it has been years since she had one. No vomiting, no fevers. No numbness/weakness in extremities. Tried OTC motrin without much improvement.    Home Medications Prior to Admission medications   Medication Sig Start Date End Date Taking? Authorizing Provider  albuterol (VENTOLIN HFA) 108 (90 Base) MCG/ACT inhaler ProAir HFA 90 mcg/actuation aerosol inhaler  INL 2 PFS PO Q 6 H PRF WHZ    [provider]  budesonide-formoterol (SYMBICORT) 80-4.5 MCG/ACT inhaler 2 puffs at bedtime. 05/17/21   [provider]  cetirizine (ZYRTEC) 10 MG tablet Take by mouth. 12/06/09   [provider]  dexlansoprazole (DEXILANT) 60 MG capsule Take by mouth. 08/18/18   [provider]  diphenhydrAMINE (BENADRYL) 25 mg capsule Inject into the vein. 12/11/16   [provider]  erythromycin ophthalmic ointment Place into the left eye 2 times daily. 04/22/19   [provider]  FLUoxetine (PROZAC) 40 MG capsule fluoxetine 40 mg capsule  TAKE 1 CAPSULE BY MOUTH EVERY DAY    [provider]  ibuprofen (ADVIL,MOTRIN) 800 MG tablet Take 1 tablet (800 mg total) by mouth 3 (three) times daily. 04/26/18   Garlon Hatchet, PA-C  LORazepam (ATIVAN) 0.5 MG tablet lorazepam 0.5 mg tablet    [provider]  montelukast (SINGULAIR) 10 MG tablet montelukast 10 mg tablet 11/03/19   [provider]  polyvinyl alcohol (LIQUIFILM TEARS) 1.4 % ophthalmic solution Place 2 drops into both eyes as needed.    [provider]  Prenat w/o A-FE-Methfol-FA-DHA (WESCAP-PN DHA) 27-0.6-0.4-300 MG CAPS Take 1 capsule by mouth daily.  04/22/21   [provider]  zolpidem (AMBIEN) 5 MG tablet zolpidem 5 mg tablet  TAKE 1 TABLET BY MOUTH EVERY DAY AT BEDTIME    [provider]     Allergies    Ceftriaxone, Ciprofloxacin, Flagyl [metronidazole], Tylenol [acetaminophen], and Other   Review of Systems   Review of Systems Please see HPI for pertinent positives and negatives  Physical Exam BP (!) 146/99 (BP Location: Left Arm)   Pulse 82   Temp 98.1 F (36.7 C) (Oral)   Resp 18   Ht 5\' 1"  (1.549 m)   Wt 95.3 kg   SpO2 97%   BMI 39.68 kg/m   Physical Exam Vitals and nursing note reviewed.  Constitutional:      Appearance: Normal appearance.  HENT:     Head: Normocephalic and atraumatic.     Nose: Nose normal.     Mouth/Throat:     Mouth: Mucous membranes are moist.  Eyes:     Extraocular Movements: Extraocular movements intact.     Conjunctiva/sclera: Conjunctivae normal.  Cardiovascular:     Rate and Rhythm: Normal rate.  Pulmonary:     Effort: Pulmonary effort is normal.     Breath sounds: Normal breath sounds.  Abdominal:     General: Abdomen is flat.     Palpations: Abdomen is soft.     Tenderness: There is no abdominal tenderness.  Musculoskeletal:        General: No swelling. Normal range of motion.     Cervical back: Neck supple.  Skin:  General: Skin is warm and dry.  Neurological:     General: No focal deficit present.     Mental Status: She is alert and oriented to person, place, and time. Mental status is at baseline.     Cranial Nerves: No cranial nerve deficit.     Sensory: No sensory deficit.     Motor: No weakness.     Gait: Gait normal.  Psychiatric:        Mood and Affect: Mood normal.    ED Results / Procedures / Treatments   EKG None  Procedures Procedures  Medications Ordered in the ED Medications  ketorolac (TORADOL) 30 MG/ML injection 30 mg (has no administration in time range)  prochlorperazine (COMPAZINE) injection 10 mg (has no  administration in time range)    Initial Impression and Plan  Patient here with migraine type headache, similar to previous with normal neuro exam. Will give Toradol/Compazine and reassess.   ED Course       MDM Rules/Calculators/A&P Medical Decision Making Risk Prescription drug management.    Final Clinical Impression(s) / ED Diagnoses Final diagnoses:  None    Rx / DC Orders ED Discharge Orders     None

## 2021-12-20 NOTE — ED Triage Notes (Signed)
Patient c/o migraine x3 days, reports pain all over head, no improvement with rest or medication. Reports nausea, denies vomiting.

## 2022-01-19 ENCOUNTER — Other Ambulatory Visit: Payer: Self-pay

## 2022-01-19 ENCOUNTER — Encounter (HOSPITAL_BASED_OUTPATIENT_CLINIC_OR_DEPARTMENT_OTHER): Payer: Self-pay | Admitting: Emergency Medicine

## 2022-01-19 DIAGNOSIS — G43001 Migraine without aura, not intractable, with status migrainosus: Secondary | ICD-10-CM | POA: Insufficient documentation

## 2022-01-19 DIAGNOSIS — Z7951 Long term (current) use of inhaled steroids: Secondary | ICD-10-CM | POA: Insufficient documentation

## 2022-01-19 DIAGNOSIS — J45909 Unspecified asthma, uncomplicated: Secondary | ICD-10-CM | POA: Insufficient documentation

## 2022-01-19 DIAGNOSIS — R519 Headache, unspecified: Secondary | ICD-10-CM | POA: Diagnosis present

## 2022-01-19 NOTE — ED Triage Notes (Signed)
Pt is c/o migraine headache  Pt states she is having dizziness with this migraine  Pt states she has had migraines off and on for the past two weeks

## 2022-01-20 ENCOUNTER — Emergency Department (HOSPITAL_BASED_OUTPATIENT_CLINIC_OR_DEPARTMENT_OTHER)
Admission: EM | Admit: 2022-01-20 | Discharge: 2022-01-20 | Disposition: A | Discharge status: Home / Self Care | Payer: Medicaid Other | Attending: Emergency Medicine | Admitting: Emergency Medicine

## 2022-01-20 ENCOUNTER — Encounter (HOSPITAL_BASED_OUTPATIENT_CLINIC_OR_DEPARTMENT_OTHER): Payer: Self-pay | Admitting: Emergency Medicine

## 2022-01-20 DIAGNOSIS — G43001 Migraine without aura, not intractable, with status migrainosus: Secondary | ICD-10-CM

## 2022-01-20 HISTORY — DX: Migraine, unspecified, not intractable, without status migrainosus: G43.909

## 2022-01-20 MED ORDER — METOCLOPRAMIDE HCL 5 MG/ML IJ SOLN
10.0000 mg | Freq: Once | INTRAMUSCULAR | Status: AC
  Administered 2022-01-20: 10 mg via INTRAVENOUS
  Filled 2022-01-20: qty 2

## 2022-01-20 MED ORDER — DIPHENHYDRAMINE HCL 50 MG/ML IJ SOLN
25.0000 mg | Freq: Once | INTRAMUSCULAR | Status: AC
  Administered 2022-01-20: 25 mg via INTRAVENOUS
  Filled 2022-01-20: qty 1

## 2022-01-20 MED ORDER — SODIUM CHLORIDE 0.9 % IV BOLUS
1000.0000 mL | Freq: Once | INTRAVENOUS | Status: AC
  Administered 2022-01-20: 1000 mL via INTRAVENOUS

## 2022-01-20 MED ORDER — KETOROLAC TROMETHAMINE 15 MG/ML IJ SOLN
15.0000 mg | Freq: Once | INTRAMUSCULAR | Status: AC
  Administered 2022-01-20: 15 mg via INTRAVENOUS
  Filled 2022-01-20: qty 1

## 2022-01-20 NOTE — ED Provider Notes (Signed)
[facility] Provider Note: Lowella Dell, MD, FACEP  CSN: [id number] MRN: [id number] ARRIVAL: 01/19/22 at 2256 ROOM: [facility]04/[facility]04   CHIEF COMPLAINT  Migraine   HISTORY OF PRESENT ILLNESS  01/20/22 12:49 AM Beth Allison is a 31 y.o. female with a history of migraines.  She is here with an intermittent headache for the past 2 weeks.  The headache is bitemporal and she rates it as a 10 out of 10.  It has not been adequately relieved with home medications.  She has had associated photophobia but no nausea or vomiting.  Yesterday she felt lightheaded to the point that she might pass out.  She believes she has been adequately hydrated.   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HCC)     Past Surgical History:  Procedure Laterality Date   ABDOMINAL HYSTERECTOMY     CESAREAN SECTION     TONSILLECTOMY      Family History  Problem Relation Age of Onset   Sinusitis Mother    Urticaria Brother    Allergic rhinitis Brother    Food Allergy Brother    Asthma Daughter    Angioedema Neg Hx    Eczema Neg Hx    Immunodeficiency Neg Hx     Social History   Tobacco Use   Smoking status: Never   Smokeless tobacco: Never  Vaping Use   Vaping Use: Never used  Substance Use Topics   Alcohol use: Not Currently   Drug use: Never    Prior to Admission medications   Medication Sig Start Date End Date Taking? Authorizing Provider  albuterol (VENTOLIN HFA) 108 (90 Base) MCG/ACT inhaler ProAir HFA 90 mcg/actuation aerosol inhaler  INL 2 PFS PO Q 6 H PRF WHZ    [provider]  budesonide-formoterol (SYMBICORT) 80-4.5 MCG/ACT inhaler 2 puffs at bedtime. 05/17/21   [provider]  cetirizine (ZYRTEC) 10 MG tablet Take by mouth. 12/06/09   [provider]  dexlansoprazole (DEXILANT) 60 MG capsule Take by mouth. 08/18/18   [provider]  diphenhydrAMINE (BENADRYL) 25 mg capsule Inject into the  vein. 12/11/16   [provider]  FLUoxetine (PROZAC) 40 MG capsule fluoxetine 40 mg capsule  TAKE 1 CAPSULE BY MOUTH EVERY DAY    [provider]  LORazepam (ATIVAN) 0.5 MG tablet lorazepam 0.5 mg tablet    [provider]  montelukast (SINGULAIR) 10 MG tablet montelukast 10 mg tablet 11/03/19   [provider]  polyvinyl alcohol (LIQUIFILM TEARS) 1.4 % ophthalmic solution Place 2 drops into both eyes as needed.    [provider]  Prenat w/o A-FE-Methfol-FA-DHA (WESCAP-PN DHA) 27-0.6-0.4-300 MG CAPS Take 1 capsule by mouth daily. 04/22/21   [provider]  zolpidem (AMBIEN) 5 MG tablet zolpidem 5 mg tablet  TAKE 1 TABLET BY MOUTH EVERY DAY AT BEDTIME    [provider]    Allergies Ceftriaxone, Ciprofloxacin, Flagyl [metronidazole], Tylenol [acetaminophen], and Other   REVIEW OF SYSTEMS  Negative except as noted here or in the History of Present Illness.   PHYSICAL EXAMINATION  Initial Vital Signs Blood pressure (!) 149/100, pulse 71, temperature 98 F (36.7 C), temperature source Oral, resp. rate 17, height 5\' 1"  (1.549 m), weight 95.3 kg, SpO2 100 %.  Examination General: Well-developed, well-nourished female in no acute distress; appearance consistent with age of record HENT: normocephalic; atraumatic Eyes: pupils equal, round and reactive to light; extraocular  muscles intact Neck: supple Heart: regular rate and rhythm Lungs: clear to auscultation bilaterally Abdomen: soft; nondistended; nontender; bowel sounds present Extremities: No deformity; full range of motion Neurologic: Awake, alert and oriented; motor function intact in all extremities and symmetric; no facial droop Skin: Warm and dry Psychiatric: Normal mood and affect   RESULTS  Summary of this visit's results, reviewed and interpreted by myself:   EKG Interpretation  Date/Time:    Ventricular Rate:    PR Interval:    QRS Duration:   QT  Interval:    QTC Calculation:   R Axis:     Text Interpretation:         Laboratory Studies: No results found for this or any previous visit (from the past 24 hour(s)). Imaging Studies: No results found.  ED COURSE and MDM  Nursing notes, initial and subsequent vitals signs, including pulse oximetry, reviewed and interpreted by myself.  Vitals:   01/19/22 2302 01/19/22 2303 01/20/22 0148  BP:  (!) 149/100 130/89  Pulse:  71 87  Resp:  17 15  Temp:  98 F (36.7 C)   TempSrc:  Oral   SpO2:  100% 94%  Weight: 95.3 kg    Height: 5\' 1"  (1.549 m)     Medications  sodium chloride 0.9 % bolus 1,000 mL (0 mLs Intravenous Stopped 01/20/22 0343)  diphenhydrAMINE (BENADRYL) injection 25 mg (25 mg Intravenous Given 01/20/22 0238)  metoCLOPramide (REGLAN) injection 10 mg (10 mg Intravenous Given 01/20/22 0238)  ketorolac (TORADOL) 15 MG/ML injection 15 mg (15 mg Intravenous Given 01/20/22 0240)   3:57 AM Headache resolved with IV fluids and medications.  I suspect this represents a prolonged migraine as it was relieved with the usual migraine medications.  She she does have a history of migraines.   PROCEDURES  Procedures   ED DIAGNOSES     ICD-10-CM   1. Migraine without aura and with status migrainosus, not intractable  G43.001          Safiyya Stokes, 03/23/22, MD 01/20/22 [phone]

## 2022-02-09 IMAGING — CT CT HEAD W/O CM
3 series · 16 of 47 positions shown, 19 images · non-contrast
Comparison: Maxillofacial CT 09/08/2016, CT head 07/23/2016

CLINICAL DATA: Headaches, epistaxis and neck pain for several
evaluated of multiple emergency departments

EXAM:
CT HEAD WITHOUT CONTRAST
TECHNIQUE: Contiguous axial images were obtained from the base of the skull
through the vertex without intravenous contrast.

[Series 2: head wo · axial · 0.45mm/px · z∈[-60,+86]mm · 10 of 35 slices shown, 13 images]
[im 3/35  brain]
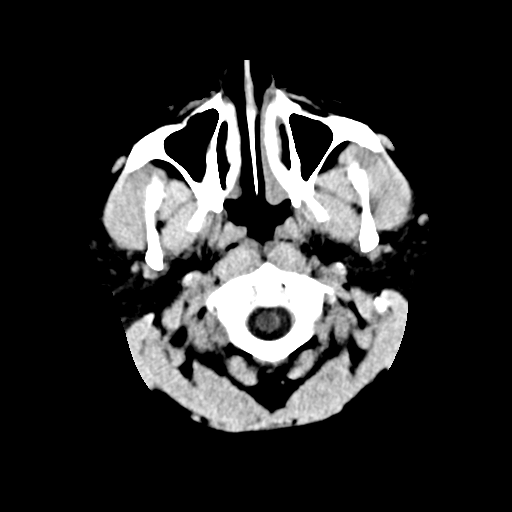
[im 3/35  bone]
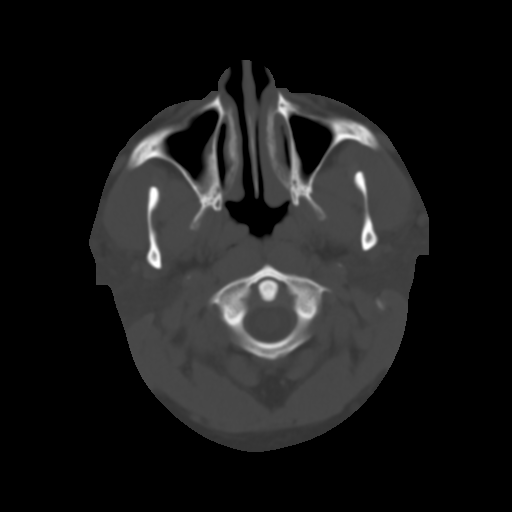
[im 6/35  brain]
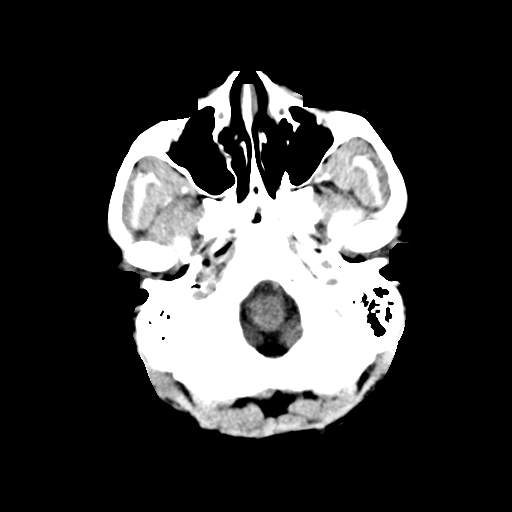
[im 10/35  brain]
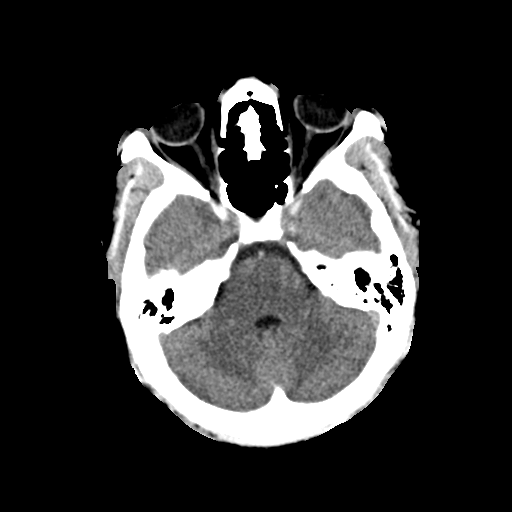
[im 12/35  brain]
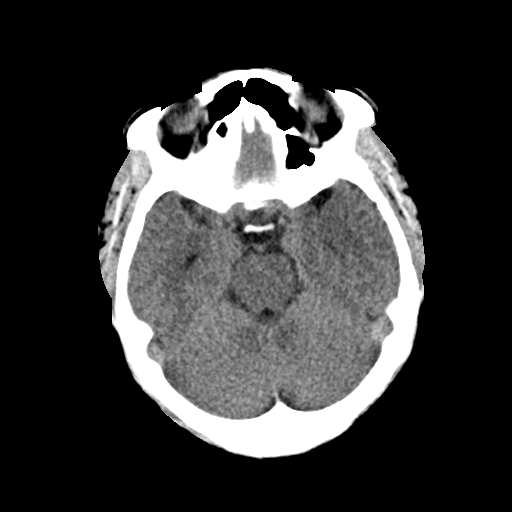
[im 16/35  brain]
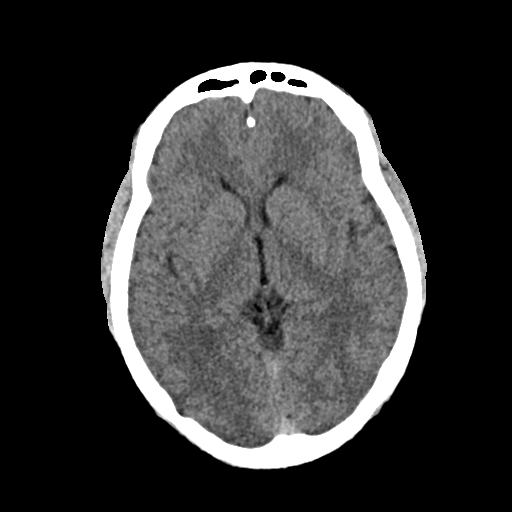
[im 16/35  bone]
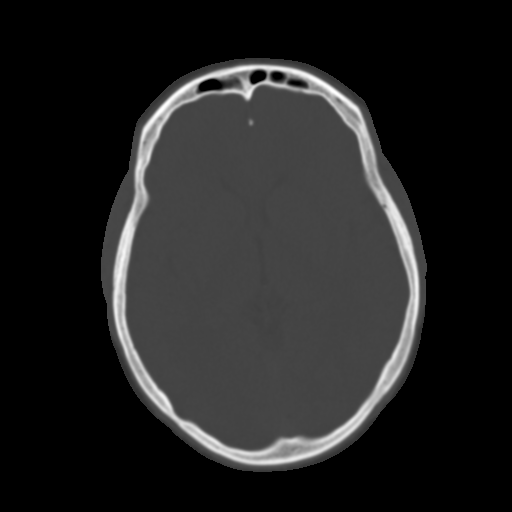
[im 19/35  brain]
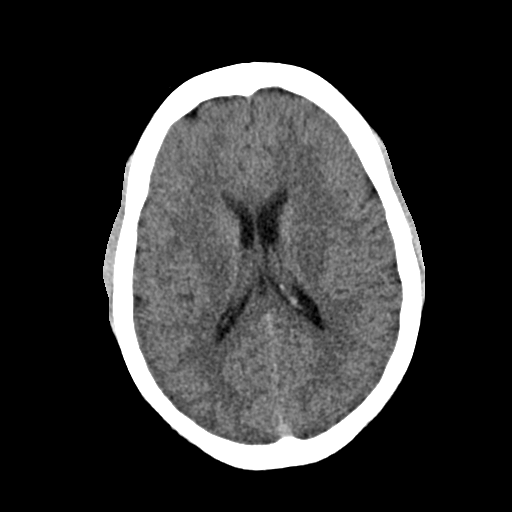
[im 23/35  brain]
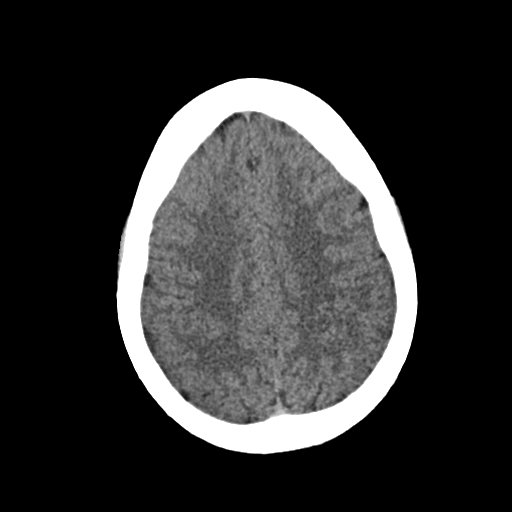
[im 26/35  brain]
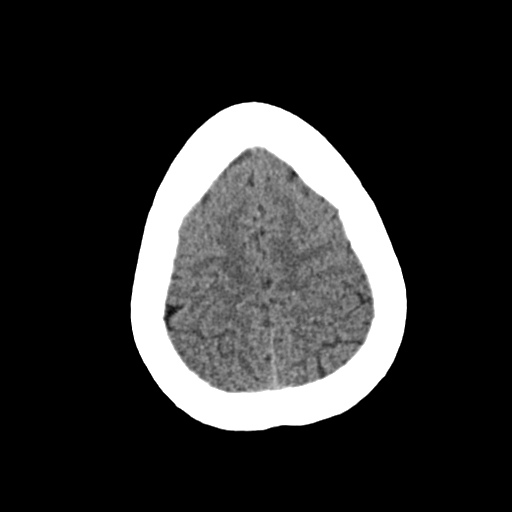
[im 29/35  brain]
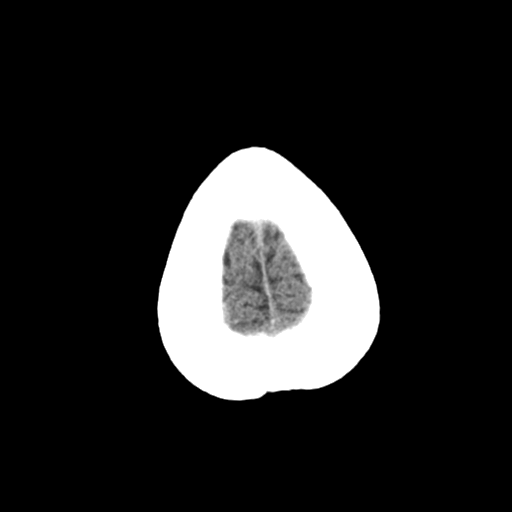
[im 29/35  bone]
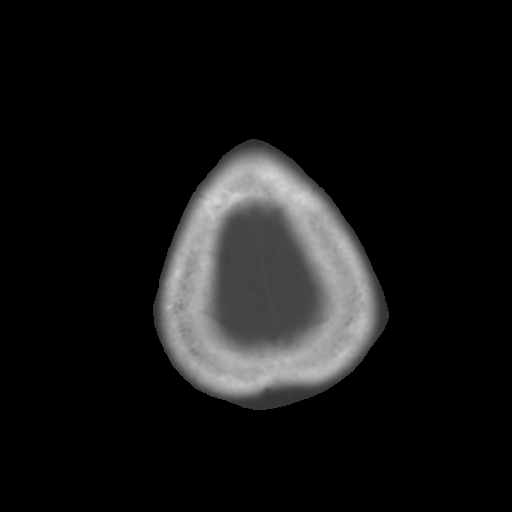
[im 32/35  brain]
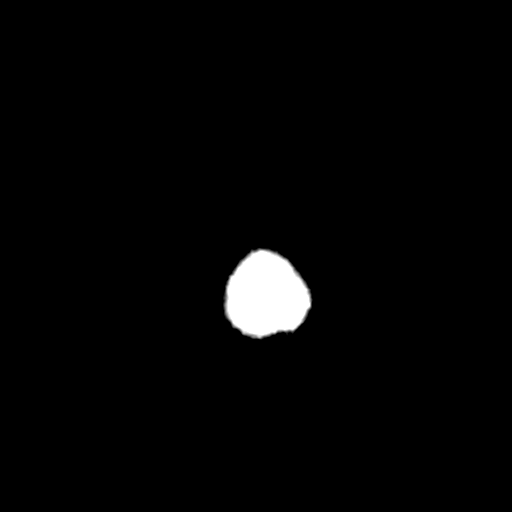

[Series 4: coronal soft · coronal · 0.34mm/px · 3 of 67 slices shown]
[im 23/67  brain]
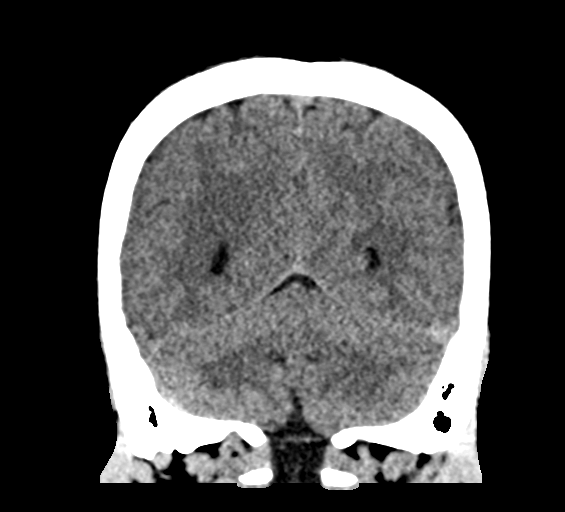
[im 30/67  brain]
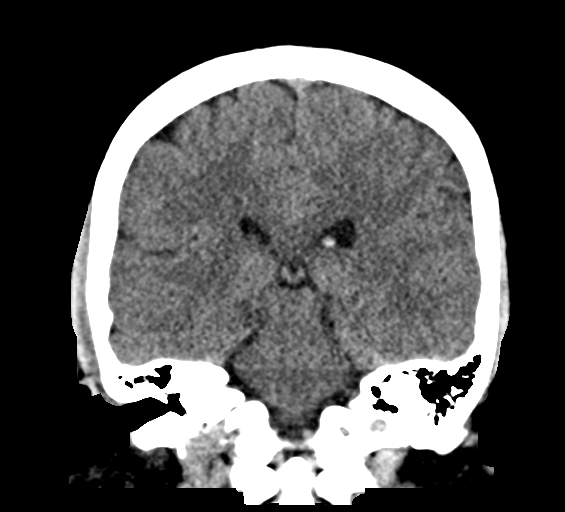
[im 37/67  brain]
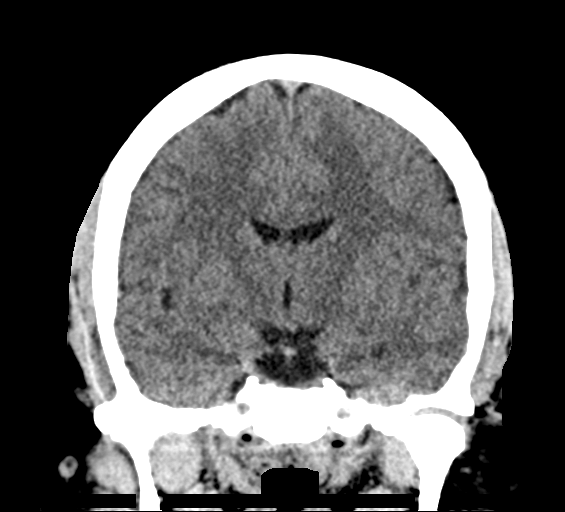

[Series 5: sag soft · sagittal · 0.34mm/px · 3 of 67 slices shown]
[im 23/67  brain]
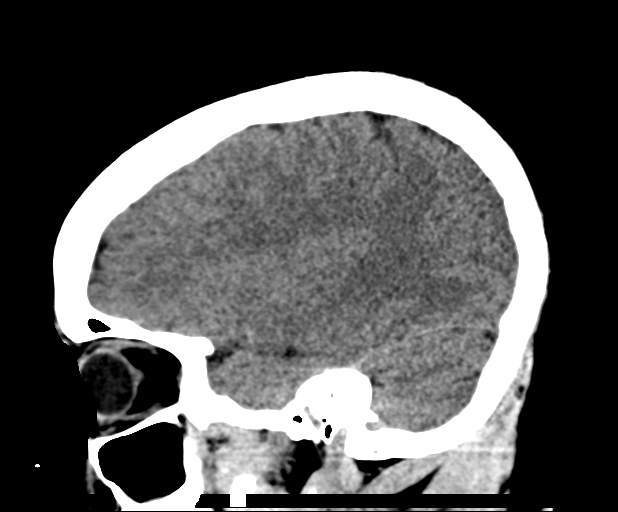
[im 34/67  brain]
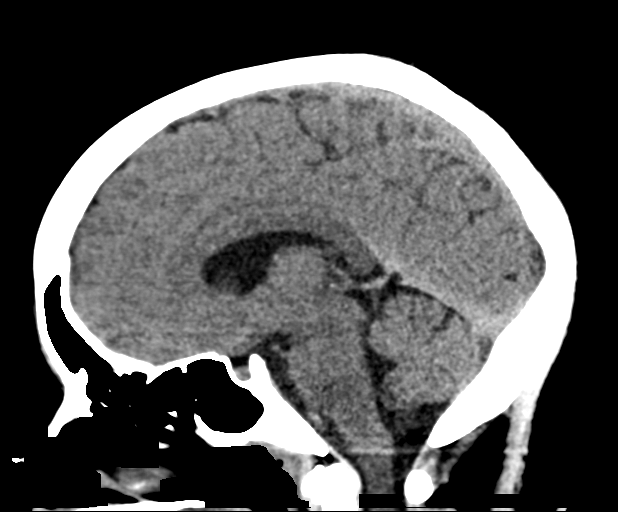
[im 45/67  brain]
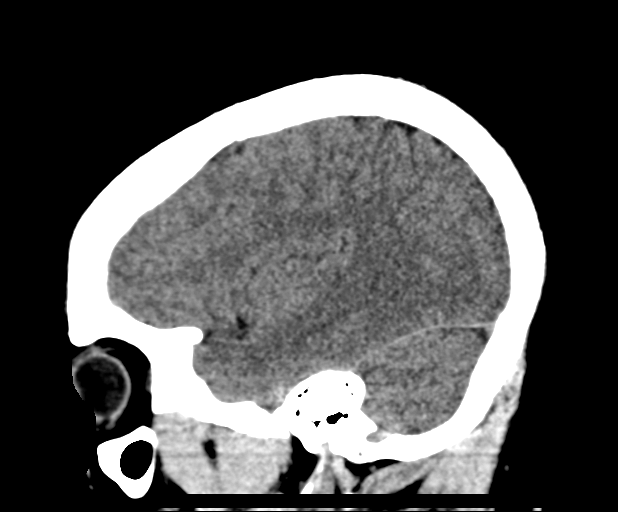

[16 of 47 positions shown; findings below may reference images not displayed]

FINDINGS: Brain: No evidence of acute infarction, hemorrhage, hydrocephalus,
extra-axial collection, visible mass lesion or mass effect.

Vascular: No hyperdense vessel or unexpected calcification.

Skull: No calvarial fracture or suspicious osseous lesion. No scalp
swelling or hematoma.

Sinuses/Orbits: Minimal mural thickening in the paranasal sinuses.
No layering air-fluid levels or pneumatized secretions. Mastoid air
cells are clear. Middle ear cavities are clear. Included orbital
structures are unremarkable.

Other: None
IMPRESSION: No acute intracranial abnormality.

## 2022-03-31 ENCOUNTER — Other Ambulatory Visit: Payer: Self-pay

## 2022-03-31 ENCOUNTER — Encounter (HOSPITAL_BASED_OUTPATIENT_CLINIC_OR_DEPARTMENT_OTHER): Payer: Self-pay | Admitting: Emergency Medicine

## 2022-03-31 ENCOUNTER — Emergency Department (HOSPITAL_BASED_OUTPATIENT_CLINIC_OR_DEPARTMENT_OTHER): Payer: Medicaid Other

## 2022-03-31 ENCOUNTER — Emergency Department (HOSPITAL_BASED_OUTPATIENT_CLINIC_OR_DEPARTMENT_OTHER)
Admission: EM | Admit: 2022-03-31 | Discharge: 2022-03-31 | Disposition: A | Discharge status: Home / Self Care | Payer: Medicaid Other | Attending: Emergency Medicine | Admitting: Emergency Medicine

## 2022-03-31 DIAGNOSIS — S0511XA Contusion of eyeball and orbital tissues, right eye, initial encounter: Secondary | ICD-10-CM | POA: Insufficient documentation

## 2022-03-31 DIAGNOSIS — J45909 Unspecified asthma, uncomplicated: Secondary | ICD-10-CM | POA: Diagnosis not present

## 2022-03-31 DIAGNOSIS — S0990XA Unspecified injury of head, initial encounter: Secondary | ICD-10-CM | POA: Diagnosis not present

## 2022-03-31 DIAGNOSIS — S0993XA Unspecified injury of face, initial encounter: Secondary | ICD-10-CM | POA: Diagnosis present

## 2022-03-31 DIAGNOSIS — Y92511 Restaurant or cafe as the place of occurrence of the external cause: Secondary | ICD-10-CM | POA: Diagnosis not present

## 2022-03-31 DIAGNOSIS — S0083XA Contusion of other part of head, initial encounter: Secondary | ICD-10-CM

## 2022-03-31 DIAGNOSIS — S0512XA Contusion of eyeball and orbital tissues, left eye, initial encounter: Secondary | ICD-10-CM | POA: Diagnosis not present

## 2022-03-31 DIAGNOSIS — S01511A Laceration without foreign body of lip, initial encounter: Secondary | ICD-10-CM | POA: Diagnosis not present

## 2022-03-31 NOTE — ED Notes (Signed)
Ice pack applied to face in triage
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
## 2022-03-31 NOTE — Discharge Instructions (Signed)
Take ibuprofen 600 mg every 6 hours as needed for pain.  Ice affected areas for 20 minutes every 2 hours while awake for the next 2 days.  Follow-up with primary doctor if symptoms or not improving in the next few days, and return to the ER if you develop any new and/or concerning symptoms.

## 2022-06-09 ENCOUNTER — Encounter (HOSPITAL_BASED_OUTPATIENT_CLINIC_OR_DEPARTMENT_OTHER): Payer: Self-pay

## 2022-06-09 ENCOUNTER — Emergency Department (HOSPITAL_BASED_OUTPATIENT_CLINIC_OR_DEPARTMENT_OTHER)
Admission: EM | Admit: 2022-06-09 | Discharge: 2022-06-09 | Disposition: A | Discharge status: Home / Self Care | Payer: Medicaid Other | Attending: Emergency Medicine | Admitting: Emergency Medicine

## 2022-06-09 ENCOUNTER — Emergency Department (HOSPITAL_BASED_OUTPATIENT_CLINIC_OR_DEPARTMENT_OTHER): Payer: Medicaid Other

## 2022-06-09 DIAGNOSIS — N83202 Unspecified ovarian cyst, left side: Secondary | ICD-10-CM | POA: Diagnosis not present

## 2022-06-09 DIAGNOSIS — Z7951 Long term (current) use of inhaled steroids: Secondary | ICD-10-CM | POA: Insufficient documentation

## 2022-06-09 DIAGNOSIS — N9489 Other specified conditions associated with female genital organs and menstrual cycle: Secondary | ICD-10-CM | POA: Diagnosis not present

## 2022-06-09 DIAGNOSIS — R1032 Left lower quadrant pain: Secondary | ICD-10-CM | POA: Diagnosis present

## 2022-06-09 DIAGNOSIS — J45909 Unspecified asthma, uncomplicated: Secondary | ICD-10-CM | POA: Insufficient documentation

## 2022-06-09 LAB — CBC WITH DIFFERENTIAL/PLATELET
Abs Immature Granulocytes: 0.03 10*3/uL (ref 0.00–0.07)
Basophils Absolute: 0 10*3/uL (ref 0.0–0.1)
Basophils Relative: 0 %
Eosinophils Absolute: 0.1 10*3/uL (ref 0.0–0.5)
Eosinophils Relative: 1 %
HCT: 37.4 % (ref 36.0–46.0)
Hemoglobin: 12.2 g/dL (ref 12.0–15.0)
Immature Granulocytes: 0 %
Lymphocytes Relative: 24 %
Lymphs Abs: 2.4 10*3/uL (ref 0.7–4.0)
MCH: 28.6 pg (ref 26.0–34.0)
MCHC: 32.6 g/dL (ref 30.0–36.0)
MCV: 87.6 fL (ref 80.0–100.0)
Monocytes Absolute: 0.6 10*3/uL (ref 0.1–1.0)
Monocytes Relative: 7 %
Neutro Abs: 6.6 10*3/uL (ref 1.7–7.7)
Neutrophils Relative %: 68 %
Platelets: 279 10*3/uL (ref 150–400)
RBC: 4.27 MIL/uL (ref 3.87–5.11)
RDW: 12.2 % (ref 11.5–15.5)
WBC: 9.7 10*3/uL (ref 4.0–10.5)
nRBC: 0 % (ref 0.0–0.2)

## 2022-06-09 LAB — BASIC METABOLIC PANEL
Anion gap: 7 (ref 5–15)
BUN: 11 mg/dL (ref 6–20)
CO2: 24 mmol/L (ref 22–32)
Calcium: 8.9 mg/dL (ref 8.9–10.3)
Chloride: 103 mmol/L (ref 98–111)
Creatinine, Ser: 0.96 mg/dL (ref 0.44–1.00)
GFR, Estimated: >60 mL/min (ref >60)
Glucose, Bld: 118 mg/dL — ABNORMAL HIGH (ref 70–99)
Potassium: 4.1 mmol/L (ref 3.5–5.1)
Sodium: 134 mmol/L — ABNORMAL LOW (ref 135–145)

## 2022-06-09 LAB — HCG, SERUM, QUALITATIVE: Preg, Serum: NEGATIVE

## 2022-06-09 LAB — URINALYSIS, ROUTINE W REFLEX MICROSCOPIC
Bilirubin Urine: NEGATIVE
Glucose, UA: NEGATIVE mg/dL
Ketones, ur: NEGATIVE mg/dL
Leukocytes,Ua: NEGATIVE
Nitrite: NEGATIVE
Protein, ur: NEGATIVE mg/dL
Specific Gravity, Urine: 1.025 (ref 1.005–1.030)
pH: 7.5 (ref 5.0–8.0)

## 2022-06-09 LAB — URINALYSIS, MICROSCOPIC (REFLEX)

## 2022-06-09 MED ORDER — FENTANYL CITRATE PF 50 MCG/ML IJ SOSY
100.0000 ug | PREFILLED_SYRINGE | Freq: Once | INTRAMUSCULAR | Status: AC
  Administered 2022-06-09: 100 ug via INTRAVENOUS
  Filled 2022-06-09: qty 2

## 2022-06-09 MED ORDER — ONDANSETRON HCL 4 MG/2ML IJ SOLN
4.0000 mg | Freq: Once | INTRAMUSCULAR | Status: AC
  Administered 2022-06-09: 4 mg via INTRAVENOUS
  Filled 2022-06-09: qty 2

## 2022-06-09 MED ORDER — OXYCODONE HCL 5 MG PO TABS
5.0000 mg | ORAL_TABLET | Freq: Once | ORAL | Status: AC
  Administered 2022-06-09: 5 mg via ORAL
  Filled 2022-06-09: qty 1

## 2022-06-09 MED ORDER — OXYCODONE HCL 5 MG PO CAPS: 5.0000 mg | ORAL_CAPSULE | ORAL | 0 refills | Status: AC | PRN

## 2022-06-09 MED ORDER — IOHEXOL 300 MG/ML  SOLN
100.0000 mL | Freq: Once | INTRAMUSCULAR | Status: AC | PRN
  Administered 2022-06-09: 100 mL via INTRAVENOUS

## 2022-06-09 NOTE — ED Provider Notes (Signed)
[facility] Provider Note: Georgena Spurling, MD, FACEP  CSN: [id number] MRN: [id number] ARRIVAL: 06/09/22 at [place]: [place]  Abdominal Pain   HISTORY OF PRESENT ILLNESS  06/09/22 3:59 AM Beth Allison is a 31 y.o. female with 2 days of left lower quadrant abdominal pain.  She rates the pain as a 9 out of 10.  It is aching in nature.  It is worse with movement or palpation.  She denies fever or chills.  She has had nausea but no vomiting or diarrhea.  She is status post hysterectomy but still has her ovaries.   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[place])     Past Surgical History:  Procedure Laterality Date   ABDOMINAL HYSTERECTOMY     CESAREAN SECTION     TONSILLECTOMY      Family History  Problem Relation Age of Onset   Sinusitis Mother    Urticaria Brother    Allergic rhinitis Brother    Food Allergy Brother    Asthma Daughter    Angioedema Neg Hx    Eczema Neg Hx    Immunodeficiency Neg Hx     Social History   Tobacco Use   Smoking status: Never   Smokeless tobacco: Never  Vaping Use   Vaping Use: Never used  Substance Use Topics   Alcohol use: Not Currently   Drug use: Never    Prior to Admission medications   Medication Sig Start Date End Date Taking? Authorizing Provider  oxycodone (OXY-IR) 5 MG capsule Take 1 capsule (5 mg total) by mouth every 4 (four) hours as needed for pain. 06/09/22  Yes Kennie Karapetian, MD  albuterol (VENTOLIN HFA) 108 (90 Base) MCG/ACT inhaler ProAir HFA 90 mcg/actuation aerosol inhaler  INL 2 PFS PO Q 6 H PRF WHZ    [provider]  budesonide-formoterol (SYMBICORT) 80-4.5 MCG/ACT inhaler 2 puffs at bedtime. 05/17/21   [provider]  cetirizine (ZYRTEC) 10 MG tablet Take by mouth. 12/06/09   [provider]  dexlansoprazole (DEXILANT) 60 MG capsule Take by mouth. 08/18/18   [provider]  diphenhydrAMINE  (BENADRYL) 25 mg capsule Inject into the vein. 12/11/16   [provider]  FLUoxetine (PROZAC) 40 MG capsule fluoxetine 40 mg capsule  TAKE 1 CAPSULE BY MOUTH EVERY DAY    [provider]  LORazepam (ATIVAN) 0.5 MG tablet lorazepam 0.5 mg tablet    [provider]  montelukast (SINGULAIR) 10 MG tablet montelukast 10 mg tablet 11/03/19   [provider]  polyvinyl alcohol (LIQUIFILM TEARS) 1.4 % ophthalmic solution Place 2 drops into both eyes as needed.    [provider]  Prenat w/o A-FE-Methfol-FA-DHA (WESCAP-PN DHA) 27-0.6-0.4-300 MG CAPS Take 1 capsule by mouth daily. 04/22/21   [provider]  zolpidem (AMBIEN) 5 MG tablet zolpidem 5 mg tablet  TAKE 1 TABLET BY MOUTH EVERY DAY AT BEDTIME    [provider]    Allergies Ceftriaxone, Ciprofloxacin, Flagyl [metronidazole], Tylenol [acetaminophen], and Other   REVIEW OF SYSTEMS  Negative except as noted here or in the History of Present Illness.   PHYSICAL EXAMINATION  Initial Vital Signs Blood pressure (!) 141/90, pulse 85, temperature 99.3 F (37.4 C), temperature source Oral, resp. rate 18, SpO2 100 %.  Examination General: Well-developed, well-nourished female in no acute distress; appearance consistent with age of record HENT: normocephalic; atraumatic Eyes: Normal  appearance Neck: supple Heart: regular rate and rhythm Lungs: clear to auscultation bilaterally Abdomen: soft; nondistended; left lower quadrant tenderness; bowel sounds present Extremities: No deformity; full range of motion; pulses normal Neurologic: Awake, alert and oriented; motor function intact in all extremities and symmetric; no facial droop Skin: Warm and dry Psychiatric: Normal mood and affect   RESULTS  Summary of this visit's results, reviewed and interpreted by myself:   EKG Interpretation  Date/Time:    Ventricular Rate:    PR Interval:    QRS Duration:   QT Interval:    QTC  Calculation:   R Axis:     Text Interpretation:         Laboratory Studies: Results for orders placed or performed during the hospital encounter of 06/09/22 (from the past 24 hour(s))  CBC with Differential     Status: None   Collection Time: 06/09/22  3:17 AM  Result Value Ref Range   WBC 9.7 4.0 - 10.5 K/uL   RBC 4.27 3.87 - 5.11 MIL/uL   Hemoglobin 12.2 12.0 - 15.0 g/dL   HCT 70.6 23.7 - 62.8 %   MCV 87.6 80.0 - 100.0 fL   MCH 28.6 26.0 - 34.0 pg   MCHC 32.6 30.0 - 36.0 g/dL   RDW 31.5 17.6 - 16.0 %   Platelets 279 150 - 400 K/uL   nRBC 0.0 0.0 - 0.2 %   Neutrophils Relative % 68 %   Neutro Abs 6.6 1.7 - 7.7 K/uL   Lymphocytes Relative 24 %   Lymphs Abs 2.4 0.7 - 4.0 K/uL   Monocytes Relative 7 %   Monocytes Absolute 0.6 0.1 - 1.0 K/uL   Eosinophils Relative 1 %   Eosinophils Absolute 0.1 0.0 - 0.5 K/uL   Basophils Relative 0 %   Basophils Absolute 0.0 0.0 - 0.1 K/uL   Immature Granulocytes 0 %   Abs Immature Granulocytes 0.03 0.00 - 0.07 K/uL  Basic metabolic panel     Status: Abnormal   Collection Time: 06/09/22  3:17 AM  Result Value Ref Range   Sodium 134 (L) 135 - 145 mmol/L   Potassium 4.1 3.5 - 5.1 mmol/L   Chloride 103 98 - 111 mmol/L   CO2 24 22 - 32 mmol/L   Glucose, Bld 118 (H) 70 - 99 mg/dL   BUN 11 6 - 20 mg/dL   Creatinine, Ser 7.37 0.44 - 1.00 mg/dL   Calcium 8.9 8.9 - 10.6 mg/dL   GFR, Estimated >26 >94 mL/min   Anion gap 7 5 - 15  hCG, serum, qualitative     Status: None   Collection Time: 06/09/22  3:17 AM  Result Value Ref Range   Preg, Serum NEGATIVE NEGATIVE  Urinalysis, Routine w reflex microscopic Urine, Clean Catch     Status: Abnormal   Collection Time: 06/09/22  4:09 AM  Result Value Ref Range   Color, Urine YELLOW YELLOW   APPearance HAZY (A) CLEAR   Specific Gravity, Urine 1.025 1.005 - 1.030   pH 7.5 5.0 - 8.0   Glucose, UA NEGATIVE NEGATIVE mg/dL   Hgb urine dipstick TRACE (A) NEGATIVE   Bilirubin Urine NEGATIVE NEGATIVE    Ketones, ur NEGATIVE NEGATIVE mg/dL   Protein, ur NEGATIVE NEGATIVE mg/dL   Nitrite NEGATIVE NEGATIVE   Leukocytes,Ua NEGATIVE NEGATIVE  Urinalysis, Microscopic (reflex)     Status: Abnormal   Collection Time: 06/09/22  4:09 AM  Result Value Ref Range   RBC / HPF 0-5  0 - 5 RBC/hpf   WBC, UA 0-5 0 - 5 WBC/hpf   Bacteria, UA FEW (A) NONE SEEN   Squamous Epithelial / LPF 6-10 0 - 5   Mucus PRESENT    Hyaline Casts, UA PRESENT    Imaging Studies: CT ABDOMEN PELVIS W CONTRAST  Result Date: 06/09/2022 CLINICAL DATA:  Left lower quadrant abdominal pain EXAM: CT ABDOMEN AND PELVIS WITH CONTRAST TECHNIQUE: Multidetector CT imaging of the abdomen and pelvis was performed using the standard protocol following bolus administration of intravenous contrast. RADIATION DOSE REDUCTION: This exam was performed according to the departmental dose-optimization program which includes automated exposure control, adjustment of the mA and/or kV according to patient size and/or use of iterative reconstruction technique. CONTRAST:  167mL OMNIPAQUE IOHEXOL 300 MG/ML  SOLN COMPARISON:  10/22/2019 abdominal CT FINDINGS: Lower chest:  No contributory findings. Hepatobiliary: No focal liver abnormality.No evidence of biliary obstruction or stone. Pancreas: Unremarkable. 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 Adrenals/Urinary Tract: Negative adrenals. No hydronephrosis or stone. Unremarkable bladder. Stomach/Bowel:  No obstruction. No appendicitis. Vascular/Lymphatic: No acute vascular abnormality. No mass or adenopathy. Reproductive:Hysterectomy. The ovary is somewhat high in the left lower quadrant and there is an internal 4.4 Cm cyst with some internal architecture and adjacent mild fat stranding. No twisting of the associated ovarian pedicle or adjacent edematous ovarian parenchyma. Other: No ascites or pneumoperitoneum.  Musculoskeletal: No acute abnormalities. IMPRESSION: 4.4 Cm complicated cyst in the symptomatic left lower quadrant, usually a hemorrhagic follicle. Recommend follow-up for ultrasound characterization. Electronically Signed   By: Jorje Guild M.D.   On: 06/09/2022 05:09    ED COURSE and MDM  Nursing notes, initial and subsequent vitals signs, including pulse oximetry, reviewed and interpreted by myself.  Vitals:   06/09/22 0259 06/09/22 0404  BP: (!) 141/90 (!) 141/90  Pulse: 85 69  Resp: 18 18  Temp: 99.3 F (37.4 C)   TempSrc: Oral   SpO2: 100% 100%   Medications  oxyCODONE (Oxy IR/ROXICODONE) immediate release tablet 5 mg (has no administration in time range)  ondansetron (ZOFRAN) injection 4 mg (4 mg Intravenous Given 06/09/22 0415)  fentaNYL (SUBLIMAZE) injection 100 mcg (100 mcg Intravenous Given 06/09/22 0415)  iohexol (OMNIPAQUE) 300 MG/ML solution 100 mL (100 mLs Intravenous Contrast Given 06/09/22 0443)   5:13 AM CT scan shows a cystic lesion in the left lower quadrant consistent with an ovarian cyst.  Surgical absence of the uterus is noted.  No evidence of diverticulitis.  We will treat the patient's pain and refer to her OB/GYN for further evaluation and treatment.   PROCEDURES  Procedures   ED DIAGNOSES     ICD-10-CM   1. Cyst of left ovary  N83.202          Devaughn Savant, Jenny Reichmann, MD 06/09/22 [phone]

## 2022-06-09 NOTE — ED Triage Notes (Addendum)
Pt self ambulated to exam 4 c/o Left Lower Quad ABD pain 8/10 ache in nature x 2 days. PT denies fever or chills last BM yesterday. VSS NAD PT states she has had partial hysterectomy 2021. PT resting on stretcher monitor applied, IV established, labs drawn. Awaiting provider to bedside.

## 2022-06-20 ENCOUNTER — Encounter (HOSPITAL_BASED_OUTPATIENT_CLINIC_OR_DEPARTMENT_OTHER): Payer: Self-pay | Admitting: Urology

## 2022-06-20 ENCOUNTER — Emergency Department (HOSPITAL_BASED_OUTPATIENT_CLINIC_OR_DEPARTMENT_OTHER)
Admission: EM | Admit: 2022-06-20 | Discharge: 2022-06-20 | Disposition: A | Discharge status: Home / Self Care | Payer: Medicaid Other | Attending: Emergency Medicine | Admitting: Emergency Medicine

## 2022-06-20 ENCOUNTER — Other Ambulatory Visit: Payer: Self-pay

## 2022-06-20 DIAGNOSIS — B9789 Other viral agents as the cause of diseases classified elsewhere: Secondary | ICD-10-CM

## 2022-06-20 DIAGNOSIS — J101 Influenza due to other identified influenza virus with other respiratory manifestations: Secondary | ICD-10-CM | POA: Insufficient documentation

## 2022-06-20 DIAGNOSIS — R519 Headache, unspecified: Secondary | ICD-10-CM

## 2022-06-20 DIAGNOSIS — Z1152 Encounter for screening for COVID-19: Secondary | ICD-10-CM | POA: Diagnosis not present

## 2022-06-20 DIAGNOSIS — J069 Acute upper respiratory infection, unspecified: Secondary | ICD-10-CM | POA: Insufficient documentation

## 2022-06-20 LAB — RESP PANEL BY RT-PCR (FLU A&B, COVID) ARPGX2
Influenza A by PCR: NEGATIVE
Influenza B by PCR: NEGATIVE
SARS Coronavirus 2 by RT PCR: NEGATIVE

## 2022-06-20 LAB — GROUP A STREP BY PCR: Group A Strep by PCR: NOT DETECTED

## 2022-06-20 MED ORDER — PREDNISONE 20 MG PO TABS: 40.0000 mg | ORAL_TABLET | Freq: Every day | ORAL | 0 refills | Status: AC

## 2022-06-20 NOTE — ED Provider Notes (Signed)
[facility] EMERGENCY DEPARTMENT Provider Note   CSN: [id number] Arrival date & time: 06/20/22  1645     History  Chief Complaint  Patient presents with   Flu Like Symptoms     Beth Allison is a 31 y.o. female.  Patient presents to the emergency department for evaluation of sore throat and headache.  Her symptoms started last night but got worse this morning when she awoke with a more significant sore throat.  Also reports congestion and poor control of asthma.  She has been using her inhaler more frequently over the past 1 week.  No vomiting or diarrhea.  No chest pain or shortness of breath.  No known sick contacts.       Home Medications Prior to Admission medications   Medication Sig Start Date End Date Taking? Authorizing Provider  albuterol (VENTOLIN HFA) 108 (90 Base) MCG/ACT inhaler ProAir HFA 90 mcg/actuation aerosol inhaler  INL 2 PFS PO Q 6 H PRF WHZ    [provider]  budesonide-formoterol (SYMBICORT) 80-4.5 MCG/ACT inhaler 2 puffs at bedtime. 05/17/21   [provider]  cetirizine (ZYRTEC) 10 MG tablet Take by mouth. 12/06/09   [provider]  dexlansoprazole (DEXILANT) 60 MG capsule Take by mouth. 08/18/18   [provider]  diphenhydrAMINE (BENADRYL) 25 mg capsule Inject into the vein. 12/11/16   [provider]  FLUoxetine (PROZAC) 40 MG capsule fluoxetine 40 mg capsule  TAKE 1 CAPSULE BY MOUTH EVERY DAY    [provider]  LORazepam (ATIVAN) 0.5 MG tablet lorazepam 0.5 mg tablet    [provider]  montelukast (SINGULAIR) 10 MG tablet montelukast 10 mg tablet 11/03/19   [provider]  oxycodone (OXY-IR) 5 MG capsule Take 1 capsule (5 mg total) by mouth every 4 (four) hours as needed for pain. 06/09/22   Molpus, John, MD  polyvinyl alcohol (LIQUIFILM TEARS) 1.4 % ophthalmic solution Place 2 drops into both eyes as needed.    [provider]  Prenat w/o  A-FE-Methfol-FA-DHA (WESCAP-PN DHA) 27-0.6-0.4-300 MG CAPS Take 1 capsule by mouth daily. 04/22/21   [provider]  zolpidem (AMBIEN) 5 MG tablet zolpidem 5 mg tablet  TAKE 1 TABLET BY MOUTH EVERY DAY AT BEDTIME    [provider]      Allergies    Ceftriaxone, Ciprofloxacin, Flagyl [metronidazole], Tylenol [acetaminophen], and Other    Review of Systems   Review of Systems  Physical Exam Updated Vital Signs BP 123/88 (BP Location: Left Arm)   Pulse 79   Temp 98.2 F (36.8 C) (Oral)   Resp 17   Ht 5\' 1"  (1.549 m)   Wt 95.3 kg   SpO2 100%   BMI 39.70 kg/m  Physical Exam Vitals and nursing note reviewed.  Constitutional:      General: She is not in acute distress.    Appearance: She is well-developed.  HENT:     Head: Normocephalic and atraumatic.     Right Ear: Tympanic membrane, ear canal and external ear normal.     Left Ear: Tympanic membrane, ear canal and external ear normal.     Nose: Congestion present. No rhinorrhea.     Mouth/Throat:     Mouth: Mucous membranes are moist.     Pharynx: No oropharyngeal exudate or posterior oropharyngeal erythema.  Eyes:     Conjunctiva/sclera: Conjunctivae normal.  Cardiovascular:     Rate and Rhythm: Normal rate and regular rhythm.     Heart sounds:  No murmur heard. Pulmonary:     Effort: No respiratory distress.     Breath sounds: No wheezing, rhonchi or rales.     Comments: No wheezing at time of exam Abdominal:     Palpations: Abdomen is soft.     Tenderness: There is no abdominal tenderness. There is no guarding or rebound.  Musculoskeletal:     Cervical back: Normal range of motion and neck supple.     Right lower leg: No edema.     Left lower leg: No edema.  Skin:    General: Skin is warm and dry.     Findings: No rash.  Neurological:     General: No focal deficit present.     Mental Status: She is alert. Mental status is at baseline.     Motor: No weakness.  Psychiatric:        Mood and  Affect: Mood normal.     ED Results / Procedures / Treatments   Labs (all labs ordered are listed, but only abnormal results are displayed) Labs Reviewed  RESP PANEL BY RT-PCR (FLU A&B, COVID) [id number]  GROUP A STREP BY PCR    EKG None  Radiology No results found.  Procedures Procedures    Medications Ordered in ED Medications - No data to display  ED Course/ Medical Decision Making/ A&P    Patient seen and examined. History obtained directly from patient. Work-up including labs, imaging, EKG ordered in triage, if performed, were reviewed.    Labs/EKG: Independently reviewed and interpreted.  This included: COVID/flu, strep were negative  Imaging: None ordered.  Consider chest x-ray however lungs clear to auscultation bilaterally and low concern for pneumonia clinically.  Medications/Fluids: Ordered: IM Toradol for headache and bodyaches  Most recent vital signs reviewed and are as follows: BP 123/88 (BP Location: Left Arm)   Pulse 79   Temp 98.2 F (36.8 C) (Oral)   Resp 17   Ht 5\' 1"  (1.549 m)   Wt 95.3 kg   SpO2 100%   BMI 39.70 kg/m   Initial impression: Viral illness with exacerbation of chronic headache and asthma  Home treatment plan: Rest, hydration, OTC meds, prednisone x 5 days.  Return instructions discussed with patient: Return with worsening severe headache, persistent vomiting, worsening shortness of breath or trouble breathing  Follow-up instructions discussed with patient: Up with PCP in 5 days for recheck if not improving                          Medical Decision Making  Patient with symptoms consistent with a viral syndrome. Vitals are stable, no fever. No signs of dehydration. Lung exam normal, no signs of pneumonia. Supportive therapy indicated with return if symptoms worsen.    In regards to the patient's headache, critical differentials were considered including subarachnoid hemorrhage, intracerebral hemorrhage, epidural/subdural  hematoma, pituitary apoplexy, vertebral/carotid artery dissection, giant cell arteritis, central venous thrombosis, reversible cerebral vasoconstriction, acute angle closure glaucoma, idiopathic intracranial hypertension, bacterial meningitis, viral encephalitis, carbon monoxide poisoning, posterior reversible encephalopathy syndrome, pre-eclampsia.   Reg flag symptoms related to these causes were considered including systemic symptoms (fever, weight loss), neurologic symptoms (confusion, mental status change, vision change, associated seizure), acute or sudden "thunderclap" onset, patient age 80 or older with new or progressive headache, patient of any age with first headache or change in headache pattern, pregnant or postpartum status, history of HIV or other immunocompromise, history of cancer, headache occurring with exertion,  associated neck or shoulder pain, associated traumatic injury, concurrent use of anticoagulation, family history of spontaneous SAH, and concurrent drug use.    Other benign, more common causes of headache were considered including migraine, tension-type headache, cluster headache, referred pain from other cause such as sinus infection, dental pain, trigeminal neuralgia.   On exam, patient has a reassuring neuro exam including baseline mental status, no significant neck pain or meningeal signs, no signs of severe infection or fever.   The patient's vital signs, pertinent lab work and imaging were reviewed and interpreted as discussed in the ED course. Hospitalization was considered for further testing, treatments, or serial exams/observation. However as patient is well-appearing, has a stable exam over the course of their evaluation, and reassuring studies today, I do not feel that they warrant admission at this time. This plan was discussed with the patient who verbalizes agreement and comfort with this plan and seems reliable and able to return to the Emergency Department with  worsening or changing symptoms.          Final Clinical Impression(s) / ED Diagnoses Final diagnoses:  Viral respiratory infection  Acute nonintractable headache, unspecified headache type    Rx / DC Orders ED Discharge Orders          Ordered    predniSONE (DELTASONE) 20 MG tablet  Daily        06/20/22 1806              Renne Crigler, Cordelia Poche 06/20/22 1809    Maia Plan, MD 06/21/22 1005

## 2022-06-20 NOTE — Discharge Instructions (Signed)
Please read and follow all provided instructions.  Your diagnoses today include:  1. Viral respiratory infection   2. Acute nonintractable headache, unspecified headache type     You appear to have an upper respiratory infection (URI). An upper respiratory tract infection, or cold, is a viral infection of the air passages leading to the lungs. It should improve gradually after 5-7 days. You may have a lingering cough that lasts for 2- 4 weeks after the infection.  Tests performed today include: Vital signs. See below for your results today.  COVID, flu, strep testing was negative  Medications prescribed:  Prednisone - steroid medicine   It is best to take this medication in the morning to prevent sleeping problems. If you are diabetic, monitor your blood sugar closely and stop taking Prednisone if blood sugar is over 300. Take with food to prevent stomach upset.   Take any prescribed medications only as directed. Treatment for your infection is aimed at treating the symptoms. There are no medications, such as antibiotics, that will cure your infection.   Home care instructions:  You can take Tylenol and/or Ibuprofen as directed on the packaging for fever reduction and pain relief.    For cough: honey 1/2 to 1 teaspoon (you can dilute the honey in water or another fluid).  You can also use guaifenesin and dextromethorphan for cough. You can use a humidifier for chest congestion and cough.  If you don't have a humidifier, you can sit in the bathroom with the hot shower running.      For sore throat: try warm salt water gargles, cepacol lozenges, throat spray, warm tea or water with lemon/honey, popsicles or ice, or OTC cold relief medicine for throat discomfort.    For congestion: take a daily anti-histamine like Zyrtec, Claritin, and a oral decongestant, such as pseudoephedrine.  You can also use Flonase 1-2 sprays in each nostril daily.    It is important to stay hydrated: drink plenty  of fluids (water, gatorade/powerade/pedialyte, juices, or teas) to keep your throat moisturized and help further relieve irritation/discomfort.   Your illness is contagious and can be spread to others, especially during the first 3 or 4 days. It cannot be cured by antibiotics or other medicines. Take basic precautions such as washing your hands often, covering your mouth when you cough or sneeze, and avoiding public places where you could spread your illness to others.   Please continue drinking plenty of fluids.  Use over-the-counter medicines as needed as directed on packaging for symptom relief.  You may also use ibuprofen or tylenol as directed on packaging for pain or fever.  Do not take multiple medicines containing Tylenol or acetaminophen to avoid taking too much of this medication.  Follow-up instructions: Please follow-up with your primary care provider in the next 3 days for further evaluation of your symptoms if you are not feeling better.   Return instructions:  Please return to the Emergency Department if you experience worsening symptoms.  RETURN IMMEDIATELY IF you develop shortness of breath, confusion or altered mental status, a new rash, become dizzy, faint, or poorly responsive, or are unable to be cared for at home. Please return if you have persistent vomiting and cannot keep down fluids or develop a fever that is not controlled by tylenol or motrin.   Please return if you have any other emergent concerns.  Additional Information:  Your vital signs today were: BP 123/88 (BP Location: Left Arm)   Pulse 79  Temp 98.2 F (36.8 C) (Oral)   Resp 17   Ht 5\' 1"  (1.549 m)   Wt 95.3 kg   SpO2 100%   BMI 39.70 kg/m  If your blood pressure (BP) was elevated above 135/85 this visit, please have this repeated by your doctor within one month. --------------

## 2022-06-20 NOTE — ED Notes (Signed)
Dc instructions and scripts reviewed with pt no questions or concerns at this time. Will follow up with pcp.  

## 2022-06-20 NOTE — ED Triage Notes (Signed)
Pt states sore thoat, ha, and congestion that started last night and worsening throughout the day

## 2022-08-02 ENCOUNTER — Encounter (HOSPITAL_BASED_OUTPATIENT_CLINIC_OR_DEPARTMENT_OTHER): Payer: Self-pay | Admitting: Emergency Medicine

## 2022-08-02 ENCOUNTER — Emergency Department (HOSPITAL_BASED_OUTPATIENT_CLINIC_OR_DEPARTMENT_OTHER)
Admission: EM | Admit: 2022-08-02 | Discharge: 2022-08-02 | Disposition: A | Discharge status: Home / Self Care | Payer: Medicaid Other | Attending: Emergency Medicine | Admitting: Emergency Medicine

## 2022-08-02 ENCOUNTER — Other Ambulatory Visit: Payer: Self-pay

## 2022-08-02 DIAGNOSIS — J45909 Unspecified asthma, uncomplicated: Secondary | ICD-10-CM | POA: Insufficient documentation

## 2022-08-02 DIAGNOSIS — Z1152 Encounter for screening for COVID-19: Secondary | ICD-10-CM | POA: Insufficient documentation

## 2022-08-02 DIAGNOSIS — B349 Viral infection, unspecified: Secondary | ICD-10-CM | POA: Insufficient documentation

## 2022-08-02 DIAGNOSIS — Z7951 Long term (current) use of inhaled steroids: Secondary | ICD-10-CM | POA: Insufficient documentation

## 2022-08-02 LAB — RESP PANEL BY RT-PCR (RSV, FLU A&B, COVID)  RVPGX2
Influenza A by PCR: NEGATIVE
Influenza B by PCR: NEGATIVE
Resp Syncytial Virus by PCR: NEGATIVE
SARS Coronavirus 2 by RT PCR: NEGATIVE

## 2022-08-02 MED ORDER — CLINDAMYCIN HCL 300 MG PO CAPS: 300.0000 mg | ORAL_CAPSULE | Freq: Three times a day (TID) | ORAL | 0 refills | Status: AC

## 2022-08-02 NOTE — Discharge Instructions (Addendum)
You are provided with antibiotics today in order to help treat your possible strep pharyngitis just like your daughter.  Because of your severe penicillin allergy you were given an antibiotic such as clindamycin, please take 1 tablet 3 times a day for the next 10 days.  I do suspect you are likely have a viral illness, however if your sore throat does not get better you may start taking his antibiotics.

## 2022-08-02 NOTE — ED Provider Notes (Signed)
[facility] DEPART[facility] Provider Note   CSN: [id number] Arrival date & time: 08/02/22  1319     History  Chief Complaint  Patient presents with   Fever    Beth Allison is a 32 y.o. female.  32 year old female with a past medical history of asthma presents to the ED with a chief complaint of fever, cough, headache for the past 2 days.  She is here accompanied by her daughter who is actually sick with strep pharyngitis.  Patient reports that she feels overall weak.  Does not have a fever here perhaps a low-grade temp.  Has not taken anything for improvement in her symptoms.  No alleviating or exacerbating factors.  No chest pain, no shortness of breath, no other complaints.  The history is provided by the patient and medical records.  Fever Associated symptoms: no chest pain, no nausea and no vomiting        Home Medications Prior to Admission medications   Medication Sig Start Date End Date Taking? Authorizing Provider  clindamycin (CLEOCIN) 300 MG capsule Take 1 capsule (300 mg total) by mouth 3 (three) times daily for 10 days. 08/02/22 08/12/22 Yes Raheel Kunkle, PA-C  albuterol (VENTOLIN HFA) 108 (90 Base) MCG/ACT inhaler ProAir HFA 90 mcg/actuation aerosol inhaler  INL 2 PFS PO Q 6 H PRF WHZ    [provider]  budesonide-formoterol (SYMBICORT) 80-4.5 MCG/ACT inhaler 2 puffs at bedtime. 05/17/21   [provider]  cetirizine (ZYRTEC) 10 MG tablet Take by mouth. 12/06/09   [provider]  dexlansoprazole (DEXILANT) 60 MG capsule Take by mouth. 08/18/18   [provider]  diphenhydrAMINE (BENADRYL) 25 mg capsule Inject into the vein. 12/11/16   [provider]  FLUoxetine (PROZAC) 40 MG capsule fluoxetine 40 mg capsule  TAKE 1 CAPSULE BY MOUTH EVERY DAY    [provider]  LORazepam (ATIVAN) 0.5 MG tablet lorazepam 0.5 mg tablet    [provider]  montelukast (SINGULAIR) 10 MG tablet montelukast  10 mg tablet 11/03/19   [provider]  oxycodone (OXY-IR) 5 MG capsule Take 1 capsule (5 mg total) by mouth every 4 (four) hours as needed for pain. 06/09/22   Molpus, John, MD  polyvinyl alcohol (LIQUIFILM TEARS) 1.4 % ophthalmic solution Place 2 drops into both eyes as needed.    [provider]  predniSONE (DELTASONE) 20 MG tablet Take 2 tablets (40 mg total) by mouth daily. 06/20/22   Carlisle Cater, PA-C  Prenat w/o A-FE-Methfol-FA-DHA (WESCAP-PN DHA) 27-0.6-0.4-300 MG CAPS Take 1 capsule by mouth daily. 04/22/21   [provider]  zolpidem (AMBIEN) 5 MG tablet zolpidem 5 mg tablet  TAKE 1 TABLET BY MOUTH EVERY DAY AT BEDTIME    [provider]      Allergies    Ceftriaxone, Ciprofloxacin, Flagyl [metronidazole], Tylenol [acetaminophen], and Other    Review of Systems   Review of Systems  Constitutional:  Positive for fever.  Respiratory:  Negative for shortness of breath.   Cardiovascular:  Negative for chest pain.  Gastrointestinal:  Negative for abdominal pain, nausea and vomiting.  Genitourinary:  Negative for flank pain.  Neurological:  Positive for weakness.    Physical Exam Updated Vital Signs BP 119/73 (BP Location: Right Arm)   Pulse 95   Temp 99.7 F (37.6 C) (Oral)   Resp 16   Wt 90.7 kg   SpO2 100%   BMI 37.79 kg/m  Physical Exam Vitals and nursing note reviewed.  Constitutional:      Appearance: Normal appearance.  HENT:     Head: Normocephalic and atraumatic.     Mouth/Throat:     Mouth: Mucous membranes are moist.     Comments: No tonsillar exudates noted, no PTA.  Eyes:     Pupils: Pupils are equal, round, and reactive to light.  Cardiovascular:     Rate and Rhythm: Normal rate.  Pulmonary:     Effort: Pulmonary effort is normal.     Breath sounds: No wheezing.  Abdominal:     General: Abdomen is flat.     Palpations: Abdomen is soft.     Tenderness: There is no abdominal tenderness.  Musculoskeletal:      Cervical back: Normal range of motion and neck supple.  Skin:    General: Skin is warm and dry.  Neurological:     Mental Status: She is alert and oriented to person, place, and time.     ED Results / Procedures / Treatments   Labs (all labs ordered are listed, but only abnormal results are displayed) Labs Reviewed  RESP PANEL BY RT-PCR (RSV, FLU A&B, COVID)  [id number]    EKG None  Radiology No results found.  Procedures Procedures    Medications Ordered in ED Medications - No data to display  ED Course/ Medical Decision Making/ A&P                             Medical Decision Making  Patient here with bodyaches, fever, sore throat, she is accompanied by her daughter who has strep pharyngitis.  She does not report any other symptoms.  Patient states symptoms have been ongoing for the past 2 days.  She arrived to the ED afebrile, perhaps a low-grade temp of 99.7 but no hypoxia, no tachycardia.  She does not have any chest pain or shortness of breath, no underlying condition.  She is overall nontoxic appearance, exam is benign with oropharynx visualized without any tonsillar exudate or PTA noted.  I do suspect likely viral illness, her sore throat just began this morning, some suspicion for strep pharyngitis as her daughter is sick with the same complaint at this time.  Her respiratory panel was negative for COVID-19, influenza.  I discussed with her prophylactic treatment with an antibiotic to help with likely strep pharyngitis, she does have a severe penicillin allergy therefore given clindamycin.  Discussed follow-up with primary care physician in the next couple of days for reevaluation.  Patient is hemodynamically stable for dispo position at this time.  Portions of this note were generated with [employer]. Dictation errors may occur despite best attempts at proofreading.   Final Clinical Impression(s) / ED Diagnoses Final diagnoses:  Viral illness    Rx /  DC Orders ED Discharge Orders          Ordered    clindamycin (CLEOCIN) 300 MG capsule  3 times daily        08/02/22 1526              Janeece Fitting, PA-C 08/02/22 1530    Gareth Morgan, MD 08/03/22 1125

## 2022-08-02 NOTE — ED Triage Notes (Signed)
Fever cough and headache x 2 days

## 2022-09-14 ENCOUNTER — Encounter (HOSPITAL_BASED_OUTPATIENT_CLINIC_OR_DEPARTMENT_OTHER): Payer: Self-pay | Admitting: Emergency Medicine

## 2022-09-14 ENCOUNTER — Other Ambulatory Visit: Payer: Self-pay

## 2022-09-14 ENCOUNTER — Emergency Department (HOSPITAL_BASED_OUTPATIENT_CLINIC_OR_DEPARTMENT_OTHER)
Admission: EM | Admit: 2022-09-14 | Discharge: 2022-09-14 | Disposition: A | Discharge status: Home / Self Care | Payer: Medicaid Other | Attending: Emergency Medicine | Admitting: Emergency Medicine

## 2022-09-14 DIAGNOSIS — H1031 Unspecified acute conjunctivitis, right eye: Secondary | ICD-10-CM | POA: Diagnosis not present

## 2022-09-14 DIAGNOSIS — H00012 Hordeolum externum right lower eyelid: Secondary | ICD-10-CM | POA: Insufficient documentation

## 2022-09-14 DIAGNOSIS — H5711 Ocular pain, right eye: Secondary | ICD-10-CM | POA: Diagnosis present

## 2022-09-14 MED ORDER — FLUORESCEIN SODIUM 1 MG OP STRP
1.0000 | ORAL_STRIP | Freq: Once | OPHTHALMIC | Status: AC
  Administered 2022-09-14: 1 via OPHTHALMIC
  Filled 2022-09-14: qty 1

## 2022-09-14 MED ORDER — GENTAMICIN SULFATE 0.3 % OP SOLN: 1.0000 [drp] | OPHTHALMIC | 0 refills | Status: AC

## 2022-09-14 MED ORDER — TETRACAINE HCL 0.5 % OP SOLN
2.0000 [drp] | Freq: Once | OPHTHALMIC | Status: AC
  Administered 2022-09-14: 2 [drp] via OPHTHALMIC
  Filled 2022-09-14: qty 4

## 2022-09-14 NOTE — Discharge Instructions (Signed)
Follow-up with ophthalmology as needed, call to schedule an appointment. Use drops as prescribed.  Patient to wash hands frequently.

## 2022-09-14 NOTE — ED Triage Notes (Signed)
Pt c/o "feeling like something is in my right eye." Symptoms started 2 days ago.

## 2022-09-14 NOTE — ED Provider Notes (Signed)
[facility] Provider Note   CSN: [id number] Arrival date & time: 09/14/22  1409     History  Chief Complaint  Patient presents with   Eye Pain    Beth Allison is a 32 y.o. female.  32 year old female presents with complaint of right eye irritation onset 2 days ago, feels like foreign body in the eye.  Does not wear glasses or contacts.  History of Stevens-Johnson syndrome, states that her eyelashes sometimes grow in and irritate her eyes.  Denies injury to the eye.  Reports mild photophobia and purulent drainage from the eye with matting shut in the morning.  No known exposure to pinkeye.       Home Medications Prior to Admission medications   Medication Sig Start Date End Date Taking? Authorizing Provider  gentamicin (GARAMYCIN) 0.3 % ophthalmic solution Place 1 drop into the right eye every 4 (four) hours. 09/14/22  Yes Tacy Learn, PA-C  albuterol (VENTOLIN HFA) 108 (90 Base) MCG/ACT inhaler ProAir HFA 90 mcg/actuation aerosol inhaler  INL 2 PFS PO Q 6 H PRF WHZ    [provider]  budesonide-formoterol (SYMBICORT) 80-4.5 MCG/ACT inhaler 2 puffs at bedtime. 05/17/21   [provider]  cetirizine (ZYRTEC) 10 MG tablet Take by mouth. 12/06/09   [provider]  dexlansoprazole (DEXILANT) 60 MG capsule Take by mouth. 08/18/18   [provider]  diphenhydrAMINE (BENADRYL) 25 mg capsule Inject into the vein. 12/11/16   [provider]  FLUoxetine (PROZAC) 40 MG capsule fluoxetine 40 mg capsule  TAKE 1 CAPSULE BY MOUTH EVERY DAY    [provider]  LORazepam (ATIVAN) 0.5 MG tablet lorazepam 0.5 mg tablet    [provider]  montelukast (SINGULAIR) 10 MG tablet montelukast 10 mg tablet 11/03/19   [provider]  oxycodone (OXY-IR) 5 MG capsule Take 1 capsule (5 mg total) by mouth every 4 (four) hours as needed for pain. 06/09/22   Molpus, John, MD  polyvinyl  alcohol (LIQUIFILM TEARS) 1.4 % ophthalmic solution Place 2 drops into both eyes as needed.    [provider]  predniSONE (DELTASONE) 20 MG tablet Take 2 tablets (40 mg total) by mouth daily. 06/20/22   Carlisle Cater, PA-C  Prenat w/o A-FE-Methfol-FA-DHA (WESCAP-PN DHA) 27-0.6-0.4-300 MG CAPS Take 1 capsule by mouth daily. 04/22/21   [provider]  zolpidem (AMBIEN) 5 MG tablet zolpidem 5 mg tablet  TAKE 1 TABLET BY MOUTH EVERY DAY AT BEDTIME    [provider]      Allergies    Ceftriaxone, Ciprofloxacin, Flagyl [metronidazole], Tylenol [acetaminophen], and Other    Review of Systems   Review of Systems Negative except as per HPI Physical Exam Updated Vital Signs BP 130/84 (BP Location: Left Arm)   Pulse 86   Temp 98.2 F (36.8 C) (Oral)   Resp 18   Ht 5' (1.524 m)   Wt 90.7 kg   SpO2 100%   BMI 39.06 kg/m  Physical Exam Vitals and nursing note reviewed.  Constitutional:      General: She is not in acute distress.    Appearance: She is well-developed. She is not diaphoretic.  HENT:     Head: Normocephalic and atraumatic.  Eyes:     General: Lids are everted, no foreign bodies appreciated.        Right eye: Discharge and hordeolum present. No foreign body.     Extraocular Movements:     Right  eye: Normal extraocular motion.     Conjunctiva/sclera:     Right eye: Right conjunctiva is injected. No chemosis, exudate or hemorrhage.    Pupils:     Right eye: No corneal abrasion or fluorescein uptake. Seidel exam negative.     Slit lamp exam:    Right eye: Photophobia present. No foreign body.     Comments: Pain resolved with tetracaine.  Pulmonary:     Effort: Pulmonary effort is normal.  Skin:    General: Skin is warm and dry.     Findings: No erythema or rash.  Neurological:     Mental Status: She is alert and oriented to person, place, and time.  Psychiatric:        Behavior: Behavior normal.     ED Results / Procedures / Treatments    Labs (all labs ordered are listed, but only abnormal results are displayed) Labs Reviewed - No data to display  EKG None  Radiology No results found.  Procedures Procedures    Medications Ordered in ED Medications  tetracaine (PONTOCAINE) 0.5 % ophthalmic solution 2 drop (2 drops Both Eyes Given by Other 09/14/22 1525)  fluorescein ophthalmic strip 1 strip (1 strip Both Eyes Given by Other 09/14/22 1525)    ED Course/ Medical Decision Making/ A&P                             Medical Decision Making Risk Prescription drug management.   32 year old female with complaint of right eye foreign body sensation, redness, drainage with photophobia.  Found to have trace purulent drainage from the right eye, conjunctiva injected, stained with fluorescein and tetracaine, no visible foreign body or corneal abrasion.  Lids everted, found to have right lower stye, no retained foreign body evident.  Patient started gentamicin drops, advised to follow with ophthalmology.        Final Clinical Impression(s) / ED Diagnoses Final diagnoses:  Acute conjunctivitis of right eye, unspecified acute conjunctivitis type  Hordeolum externum of right lower eyelid    Rx / DC Orders ED Discharge Orders          Ordered    gentamicin (GARAMYCIN) 0.3 % ophthalmic solution  Every 4 hours        09/14/22 1539              Tacy Learn, PA-C 09/14/22 1540    Gareth Morgan, MD 09/15/22 1258

## 2023-07-07 ENCOUNTER — Other Ambulatory Visit: Payer: Self-pay

## 2023-07-07 ENCOUNTER — Emergency Department (HOSPITAL_BASED_OUTPATIENT_CLINIC_OR_DEPARTMENT_OTHER)
Admission: EM | Admit: 2023-07-07 | Discharge: 2023-07-07 | Disposition: A | Discharge status: Home / Self Care | Payer: Self-pay | Attending: Emergency Medicine | Admitting: Emergency Medicine

## 2023-07-07 ENCOUNTER — Encounter (HOSPITAL_BASED_OUTPATIENT_CLINIC_OR_DEPARTMENT_OTHER): Payer: Self-pay | Admitting: Emergency Medicine

## 2023-07-07 DIAGNOSIS — R112 Nausea with vomiting, unspecified: Secondary | ICD-10-CM | POA: Insufficient documentation

## 2023-07-07 DIAGNOSIS — R519 Headache, unspecified: Secondary | ICD-10-CM | POA: Insufficient documentation

## 2023-07-07 MED ORDER — PROCHLORPERAZINE EDISYLATE 10 MG/2ML IJ SOLN
10.0000 mg | Freq: Once | INTRAMUSCULAR | Status: AC
  Administered 2023-07-07: 10 mg via INTRAMUSCULAR
  Filled 2023-07-07: qty 2

## 2023-07-07 MED ORDER — DEXAMETHASONE 4 MG PO TABS
10.0000 mg | ORAL_TABLET | Freq: Once | ORAL | Status: AC
  Administered 2023-07-07: 10 mg via ORAL
  Filled 2023-07-07: qty 3

## 2023-07-07 MED ORDER — DIPHENHYDRAMINE HCL 50 MG/ML IJ SOLN
25.0000 mg | Freq: Once | INTRAMUSCULAR | Status: AC
  Administered 2023-07-07: 25 mg via INTRAMUSCULAR
  Filled 2023-07-07: qty 1

## 2023-07-07 NOTE — ED Provider Notes (Signed)
[facility] Provider Note   CSN: [id number] Arrival date & time: 07/07/23  0131     History  Chief Complaint  Patient presents with   Headache    Beth Allison is a 32 y.o. female.  32 yo F with a chief complaints of a left-sided headache.  Going on for about 48 hours.  Throbbing worse with bright lights and loud noises patient has had some nausea and vomiting with this.  She has a history of chronic headaches and thinks this feels different.  Usually they are bitemporal.  She denies cough congestion or fever denies trauma denies neck pain denies one-sided numbness or weakness or difficulty speech or swallowing.   Headache      Home Medications Prior to Admission medications   Medication Sig Start Date End Date Taking? Authorizing Provider  albuterol (VENTOLIN HFA) 108 (90 Base) MCG/ACT inhaler ProAir HFA 90 mcg/actuation aerosol inhaler  INL 2 PFS PO Q 6 H PRF WHZ    [provider]  budesonide-formoterol (SYMBICORT) 80-4.5 MCG/ACT inhaler 2 puffs at bedtime. 05/17/21   [provider]  cetirizine (ZYRTEC) 10 MG tablet Take by mouth. 12/06/09   [provider]  dexlansoprazole (DEXILANT) 60 MG capsule Take by mouth. 08/18/18   [provider]  diphenhydrAMINE (BENADRYL) 25 mg capsule Inject into the vein. 12/11/16   [provider]  FLUoxetine (PROZAC) 40 MG capsule fluoxetine 40 mg capsule  TAKE 1 CAPSULE BY MOUTH EVERY DAY    [provider]  gentamicin (GARAMYCIN) 0.3 % ophthalmic solution Place 1 drop into the right eye every 4 (four) hours. 09/14/22   Jeannie Fend, PA-C  LORazepam (ATIVAN) 0.5 MG tablet lorazepam 0.5 mg tablet    [provider]  montelukast (SINGULAIR) 10 MG tablet montelukast 10 mg tablet 11/03/19   [provider]  oxycodone (OXY-IR) 5 MG capsule Take 1 capsule (5 mg total) by mouth every 4 (four) hours as needed for pain. 06/09/22    Molpus, John, MD  polyvinyl alcohol (LIQUIFILM TEARS) 1.4 % ophthalmic solution Place 2 drops into both eyes as needed.    [provider]  predniSONE (DELTASONE) 20 MG tablet Take 2 tablets (40 mg total) by mouth daily. 06/20/22   Renne Crigler, PA-C  Prenat w/o A-FE-Methfol-FA-DHA (WESCAP-PN DHA) 27-0.6-0.4-300 MG CAPS Take 1 capsule by mouth daily. 04/22/21   [provider]  zolpidem (AMBIEN) 5 MG tablet zolpidem 5 mg tablet  TAKE 1 TABLET BY MOUTH EVERY DAY AT BEDTIME    [provider]      Allergies    Ceftriaxone, Ciprofloxacin, Flagyl [metronidazole], Tylenol [acetaminophen], and Other    Review of Systems   Review of Systems  Neurological:  Positive for headaches.    Physical Exam Updated Vital Signs BP 138/89 (BP Location: Right Arm)   Pulse 65   Temp 98.1 F (36.7 C) (Oral)   Resp 20   Ht 5' (1.524 m)   Wt 95.3 kg   SpO2 100%   BMI 41.01 kg/m  Physical Exam Vitals and nursing note reviewed.  Constitutional:      General: She is not in acute distress.    Appearance: She is well-developed. She is not diaphoretic.  HENT:     Head: Normocephalic and atraumatic.  Eyes:     Pupils: Pupils are equal, round, and reactive to light.  Cardiovascular:     Rate and Rhythm: Normal rate and regular rhythm.  Heart sounds: No murmur heard.    No friction rub. No gallop.  Pulmonary:     Effort: Pulmonary effort is normal.     Breath sounds: No wheezing or rales.  Abdominal:     General: There is no distension.     Palpations: Abdomen is soft.     Tenderness: There is no abdominal tenderness.  Musculoskeletal:        General: No tenderness.     Cervical back: Normal range of motion and neck supple.  Skin:    General: Skin is warm and dry.  Neurological:     Mental Status: She is alert and oriented to person, place, and time.     GCS: GCS eye subscore is 4. GCS verbal subscore is 5. GCS motor subscore is 6.     Cranial Nerves: Cranial  nerves 2-12 are intact.     Sensory: Sensation is intact.     Motor: Motor function is intact.     Coordination: Coordination is intact.     Comments: Benign neuro exam  Psychiatric:        Behavior: Behavior normal.     ED Results / Procedures / Treatments   Labs (all labs ordered are listed, but only abnormal results are displayed) Labs Reviewed - No data to display  EKG None  Radiology No results found.  Procedures Procedures    Medications Ordered in ED Medications  prochlorperazine (COMPAZINE) injection 10 mg (has no administration in time range)  diphenhydrAMINE (BENADRYL) injection 25 mg (has no administration in time range)  dexamethasone (DECADRON) tablet 10 mg (has no administration in time range)    ED Course/ Medical Decision Making/ A&P                                 Medical Decision Making Risk Prescription drug management.   32 yo F with a chief complaints of a left-sided headache.  Patient has a history of headaches but thinks this feels different.  She has a benign neurologic exam.  It was slow in onset, no fevers no neck pain or stiffness.  Will give a headache cocktail here.  Will have her follow-up with her PCP in the office.  With her having recurrent headaches and multiple visits to the ED and I will send a referral to neurology.  2:10 AM:  I have discussed the diagnosis/risks/treatment options with the patient.  Evaluation and diagnostic testing in the emergency department does not suggest an emergent condition requiring admission or immediate intervention beyond what has been performed at this time.  They will follow up with Neuro. We also discussed returning to the ED immediately if new or worsening sx occur. We discussed the sx which are most concerning (e.g., sudden worsening pain, fever, inability to tolerate by mouth, stroke s/sx) that necessitate immediate return. Medications administered to the patient during their visit and any new  prescriptions provided to the patient are listed below.  Medications given during this visit Medications  prochlorperazine (COMPAZINE) injection 10 mg (has no administration in time range)  diphenhydrAMINE (BENADRYL) injection 25 mg (has no administration in time range)  dexamethasone (DECADRON) tablet 10 mg (has no administration in time range)     The patient appears reasonably screen and/or stabilized for discharge and I doubt any other medical condition or other [facility] requiring further screening, evaluation, or treatment in the ED at this time prior to discharge.  Final Clinical Impression(s) / ED Diagnoses Final diagnoses:  Left-sided headache    Rx / DC Orders ED Discharge Orders          Ordered    Ambulatory referral to Neurology       Comments: Headache syndrome?   07/07/23 0206              Melene Plan, DO 07/07/23 0210

## 2023-07-07 NOTE — Discharge Instructions (Signed)
Follow up with your PCP and with the neurologist.  Return for sudden worsening one sided numbness or weakness or difficulty with speech or swallowing

## 2023-07-07 NOTE — ED Triage Notes (Signed)
HA that has been going on for 2 days, worse tonight. BIL eye tearing, pt reports photophobia. Denies neck stiffness or fever, blurred vision or dizziness. No know exposures to illness. Vomited twice the first day of the headache, but not since. Hx of migraines but states this does not feel the same.
